# Patient Record
Sex: Female | Born: 1985 | Hispanic: Yes | Marital: Married | Smoking: Never smoker
Health system: Southern US, Community
[De-identification: ages and names within clinical notes are randomized; demographics above are authoritative.]

## PROBLEM LIST (undated history)

## (undated) DIAGNOSIS — O24419 Gestational diabetes mellitus in pregnancy, unspecified control: Secondary | ICD-10-CM

## (undated) DIAGNOSIS — Z789 Other specified health status: Secondary | ICD-10-CM

## (undated) HISTORY — PX: EYE SURGERY: SHX253

## (undated) HISTORY — DX: Gestational diabetes mellitus in pregnancy, unspecified control: O24.419

---

## 2010-05-06 ENCOUNTER — Ambulatory Visit (HOSPITAL_COMMUNITY): Admission: RE | Admit: 2010-05-06 | Discharge: 2010-05-06 | Payer: Self-pay | Admitting: Family Medicine

## 2010-06-20 ENCOUNTER — Ambulatory Visit (HOSPITAL_COMMUNITY): Admission: RE | Admit: 2010-06-20 | Discharge: 2010-06-20 | Payer: Self-pay | Admitting: Family Medicine

## 2010-11-24 ENCOUNTER — Ambulatory Visit (HOSPITAL_COMMUNITY)
Admission: RE | Admit: 2010-11-24 | Discharge: 2010-11-24 | Payer: Self-pay | Source: Home / Self Care | Attending: Obstetrics and Gynecology | Admitting: Obstetrics and Gynecology

## 2010-12-01 ENCOUNTER — Inpatient Hospital Stay (HOSPITAL_COMMUNITY)
Admission: AD | Admit: 2010-12-01 | Discharge: 2010-12-03 | DRG: 775 | Disposition: A | Discharge status: Home / Self Care | Payer: Medicaid Other | Source: Ambulatory Visit | Attending: Obstetrics and Gynecology | Admitting: Obstetrics and Gynecology

## 2010-12-01 DIAGNOSIS — L299 Pruritus, unspecified: Secondary | ICD-10-CM | POA: Diagnosis not present

## 2010-12-01 DIAGNOSIS — O26899 Other specified pregnancy related conditions, unspecified trimester: Secondary | ICD-10-CM | POA: Diagnosis not present

## 2010-12-01 DIAGNOSIS — O411090 Infection of amniotic sac and membranes, unspecified, unspecified trimester, not applicable or unspecified: Secondary | ICD-10-CM | POA: Diagnosis present

## 2010-12-01 DIAGNOSIS — O480 Post-term pregnancy: Principal | ICD-10-CM | POA: Diagnosis present

## 2010-12-01 LAB — CBC
MCH: 29.4 pg (ref 26.0–34.0)
RDW: 13.6 % (ref 11.5–15.5)
WBC: 10.6 10*3/uL — ABNORMAL HIGH (ref 4.0–10.5)

## 2010-12-01 LAB — RPR: RPR Ser Ql: NONREACTIVE

## 2010-12-02 DIAGNOSIS — O480 Post-term pregnancy: Secondary | ICD-10-CM

## 2010-12-02 DIAGNOSIS — O411090 Infection of amniotic sac and membranes, unspecified, unspecified trimester, not applicable or unspecified: Secondary | ICD-10-CM

## 2010-12-02 LAB — ABO/RH: ABO/RH(D): O POS

## 2011-10-31 NOTE — L&D Delivery Note (Signed)
I was present for the delivery of baby. Sharen Counter , CNM present for inspection of perineum and delivery of placement. I gree with above. No lacs  [place], [place] 10/17/2012 1:08 PM

## 2011-10-31 NOTE — L&D Delivery Note (Signed)
Delivery Note At 1032 a viable female was delivered via  (Presentation: LOA).  APGAR: pending documentation; weight pending. Placenta status: spontaneous, intact.  Cord: 3 vessels with the following complications: nuchal cord, delivered through.  Cord pH: not drawn Of note, pt delivered precipitously in MAU. Ivonne Andrew present for delivery of infant, L. Leftwich-Kirby present for delivery of placenta.  Anesthesia: None  Episiotomy: None Lacerations: None Suture Repair: n/a Est. Blood Loss (mL): 250  Mom to postpartum.  Baby to nursery-stable.  Street, Christopher 10/17/2012, 10:51 AM

## 2012-05-13 ENCOUNTER — Other Ambulatory Visit (HOSPITAL_COMMUNITY): Payer: Self-pay | Admitting: Nurse Practitioner

## 2012-05-13 DIAGNOSIS — Z3689 Encounter for other specified antenatal screening: Secondary | ICD-10-CM

## 2012-05-28 ENCOUNTER — Ambulatory Visit (HOSPITAL_COMMUNITY)
Admission: RE | Admit: 2012-05-28 | Discharge: 2012-05-28 | Disposition: A | Discharge status: Home / Self Care | Payer: Medicaid Other | Source: Ambulatory Visit | Attending: Nurse Practitioner | Admitting: Nurse Practitioner

## 2012-05-28 ENCOUNTER — Encounter (HOSPITAL_COMMUNITY): Payer: Self-pay

## 2012-05-28 DIAGNOSIS — Z3689 Encounter for other specified antenatal screening: Secondary | ICD-10-CM

## 2012-05-28 DIAGNOSIS — O358XX0 Maternal care for other (suspected) fetal abnormality and damage, not applicable or unspecified: Secondary | ICD-10-CM | POA: Insufficient documentation

## 2012-05-28 DIAGNOSIS — Z363 Encounter for antenatal screening for malformations: Secondary | ICD-10-CM | POA: Insufficient documentation

## 2012-05-28 DIAGNOSIS — Z1389 Encounter for screening for other disorder: Secondary | ICD-10-CM | POA: Insufficient documentation

## 2012-09-30 ENCOUNTER — Other Ambulatory Visit (HOSPITAL_COMMUNITY): Payer: Self-pay | Admitting: Family

## 2012-10-03 ENCOUNTER — Ambulatory Visit (HOSPITAL_COMMUNITY)
Admission: RE | Admit: 2012-10-03 | Discharge: 2012-10-03 | Disposition: A | Discharge status: Home / Self Care | Payer: Medicaid Other | Source: Ambulatory Visit | Attending: Family | Admitting: Family

## 2012-10-03 DIAGNOSIS — O365990 Maternal care for other known or suspected poor fetal growth, unspecified trimester, not applicable or unspecified: Secondary | ICD-10-CM | POA: Insufficient documentation

## 2012-10-03 DIAGNOSIS — Z3689 Encounter for other specified antenatal screening: Secondary | ICD-10-CM | POA: Insufficient documentation

## 2012-10-16 ENCOUNTER — Encounter (HOSPITAL_COMMUNITY): Payer: Self-pay

## 2012-10-16 ENCOUNTER — Inpatient Hospital Stay (HOSPITAL_COMMUNITY)
Admission: AD | Admit: 2012-10-16 | Discharge: 2012-10-16 | Disposition: A | Discharge status: Home / Self Care | Payer: Medicaid Other | Source: Ambulatory Visit | Attending: Obstetrics & Gynecology | Admitting: Obstetrics & Gynecology

## 2012-10-16 DIAGNOSIS — N949 Unspecified condition associated with female genital organs and menstrual cycle: Secondary | ICD-10-CM | POA: Insufficient documentation

## 2012-10-16 DIAGNOSIS — O99891 Other specified diseases and conditions complicating pregnancy: Secondary | ICD-10-CM | POA: Insufficient documentation

## 2012-10-16 DIAGNOSIS — O479 False labor, unspecified: Secondary | ICD-10-CM | POA: Insufficient documentation

## 2012-10-16 HISTORY — DX: Other specified health status: Z78.9

## 2012-10-16 NOTE — MAU Note (Signed)
Pt noticed some brown vaginal discharge at 6 pm tonight that is now light pink

## 2012-10-16 NOTE — MAU Provider Note (Signed)
  History     CSN: [id number]  Arrival date and time: 10/16/12 2143   None     Chief Complaint  Patient presents with  . Vaginal Discharge   HPI Judy David is 26yo, G3P1011, with [redacted]w[redacted]d presents c/o vaginal discharge brownish color around 18:00h and some vaginal bleeding. Also pt reports some contractions . She denied leaking fluid, she reports good fetal movement.   Past Medical History  Diagnosis Date  . No pertinent past medical history     Past Surgical History  Procedure Date  . Eye surgery     History reviewed. No pertinent family history.  History  Substance Use Topics  . Smoking status: Never Smoker   . Smokeless tobacco: Not on file  . Alcohol Use: No    Allergies: No Known Allergies  Prescriptions prior to admission  Medication Sig Dispense Refill  . acetaminophen (TYLENOL) 325 MG tablet Take 650 mg by mouth every 6 (six) hours as needed.      . prenatal vitamin w/FE, FA (PRENATAL 1 + 1) 27-1 MG TABS Take 1 tablet by mouth daily.        Review of Systems  Constitutional: Negative for fever.  Eyes: Negative for blurred vision.  Cardiovascular: Negative for chest pain.  Gastrointestinal: Negative for nausea and vomiting.  Neurological: Negative for headaches.   Physical Exam   Blood pressure 125/68, pulse 99, temperature 97.9 F (36.6 C), temperature source Oral, resp. rate 18, height 4\' 11"  (1.499 m), weight 76.658 kg (169 lb), last menstrual period 01/18/2012.  Physical Exam  Constitutional: She appears well-developed.  HENT:  Head: Normocephalic.  Cardiovascular: Normal rate and regular rhythm.   Respiratory: Effort normal and breath sounds normal.  FHT baseline 150, moderate variability, accels present, no decels, Category I Dilation: 2.5 Effacement (%): 50 Cervical Position: Posterior Station: -3 Presentation: Vertex Exam by:: Dr. Chelsea Aus   MAU Course  Procedures  MDM Labor check   Assessment and Plan  Judy  David is 26yo, [id number] with vaginal discharge. Labor check, no bleeding during the cervical exam Discussed labor signs Patient is stable to be discharge   Governor Specking 10/16/2012, 10:44 PM

## 2012-10-17 ENCOUNTER — Encounter (HOSPITAL_COMMUNITY): Payer: Self-pay | Admitting: *Deleted

## 2012-10-17 ENCOUNTER — Inpatient Hospital Stay (HOSPITAL_COMMUNITY)
Admission: AD | Admit: 2012-10-17 | Discharge: 2012-10-18 | DRG: 775 | Disposition: A | Discharge status: Home / Self Care | Payer: Medicaid Other | Source: Ambulatory Visit | Attending: Obstetrics & Gynecology | Admitting: Obstetrics & Gynecology

## 2012-10-17 DIAGNOSIS — Z283 Underimmunization status: Secondary | ICD-10-CM

## 2012-10-17 DIAGNOSIS — IMO0001 Reserved for inherently not codable concepts without codable children: Secondary | ICD-10-CM

## 2012-10-17 MED ORDER — ONDANSETRON HCL 4 MG/2ML IJ SOLN: 4.0000 mg | INTRAMUSCULAR | Status: DC | PRN

## 2012-10-17 MED ORDER — OXYCODONE-ACETAMINOPHEN 5-325 MG PO TABS: 1.0000 | ORAL_TABLET | ORAL | Status: DC | PRN

## 2012-10-17 MED ORDER — DIBUCAINE 1 % RE OINT: 1.0000 "application " | TOPICAL_OINTMENT | RECTAL | Status: DC | PRN

## 2012-10-17 MED ORDER — SENNOSIDES-DOCUSATE SODIUM 8.6-50 MG PO TABS
2.0000 | ORAL_TABLET | Freq: Every day | ORAL | Status: DC
  Administered 2012-10-17: 2 via ORAL

## 2012-10-17 MED ORDER — ZOLPIDEM TARTRATE 5 MG PO TABS: 5.0000 mg | ORAL_TABLET | Freq: Every evening | ORAL | Status: DC | PRN

## 2012-10-17 MED ORDER — TETANUS-DIPHTH-ACELL PERTUSSIS 5-2.5-18.5 LF-MCG/0.5 IM SUSP: 0.5000 mL | Freq: Once | INTRAMUSCULAR | Status: DC

## 2012-10-17 MED ORDER — WITCH HAZEL-GLYCERIN EX PADS: 1.0000 "application " | MEDICATED_PAD | CUTANEOUS | Status: DC | PRN

## 2012-10-17 MED ORDER — DIPHENHYDRAMINE HCL 25 MG PO CAPS: 25.0000 mg | ORAL_CAPSULE | Freq: Four times a day (QID) | ORAL | Status: DC | PRN

## 2012-10-17 MED ORDER — MEASLES, MUMPS & RUBELLA VAC ~~LOC~~ INJ
0.5000 mL | INJECTION | Freq: Once | SUBCUTANEOUS | Status: DC
  Filled 2012-10-17: qty 0.5

## 2012-10-17 MED ORDER — BENZOCAINE-MENTHOL 20-0.5 % EX AERO
1.0000 "application " | INHALATION_SPRAY | CUTANEOUS | Status: DC | PRN
  Administered 2012-10-17 – 2012-10-18 (×2): 1 via TOPICAL
  Filled 2012-10-17 (×2): qty 56

## 2012-10-17 MED ORDER — IBUPROFEN 600 MG PO TABS
600.0000 mg | ORAL_TABLET | Freq: Four times a day (QID) | ORAL | Status: DC
  Administered 2012-10-17 – 2012-10-18 (×5): 600 mg via ORAL
  Filled 2012-10-17 (×4): qty 1

## 2012-10-17 MED ORDER — LANOLIN HYDROUS EX OINT: TOPICAL_OINTMENT | CUTANEOUS | Status: DC | PRN

## 2012-10-17 MED ORDER — ONDANSETRON HCL 4 MG PO TABS: 4.0000 mg | ORAL_TABLET | ORAL | Status: DC | PRN

## 2012-10-17 MED ORDER — PRENATAL MULTIVITAMIN CH
1.0000 | ORAL_TABLET | Freq: Every day | ORAL | Status: DC
  Administered 2012-10-18: 1 via ORAL
  Filled 2012-10-17: qty 1

## 2012-10-17 MED ORDER — SIMETHICONE 80 MG PO CHEW: 80.0000 mg | CHEWABLE_TABLET | ORAL | Status: DC | PRN

## 2012-10-17 NOTE — MAU Note (Signed)
Pt appears very uncomfortable, no bleeding or leaking.

## 2012-10-17 NOTE — MAU Provider Note (Signed)
Seen and agree with note Lovett Coffin CNM 

## 2012-10-17 NOTE — H&P (Signed)
Judy David is a 26 y.o. female G2P2001 at [redacted]w[redacted]d by 18w [place], presenting in active labor. Contractions started ~5 hours ago and are now very strong with pt involuntarily pushing (see exam, below).  Denies LOF or gush of fluid prior to arrival; ROM apparent with fluid-soaked on bed between check where pt found to be complete and arrival of delivering provider Dr. Casper Harrison. No bleeding. Reported good fetal movements. Otherwise no complaints; no fever/chills, N/V, RUQ pain, new/worse swelling, headache, or change in vision.  [facility] Denies complications with pregnancy; per records is Rubella non-immune and was late to care at ~17w.  Maternal Medical History:  Reason for admission: Reason for admission: contractions.  Contractions: Frequency: regular.   Perceived severity is strong.    Fetal activity: Perceived fetal activity is normal.   Last perceived fetal movement was within the past hour.    Prenatal complications: no prenatal complications Prenatal Complications - Diabetes: none.    OB History    Grav Para Term Preterm Abortions TAB SAB Ect Mult Living   2 2 2       1      Past Medical History  Diagnosis Date  . No pertinent past medical history    Past Surgical History  Procedure Date  . Eye surgery    Family History: family history is not on file. Social History:  reports that she has never smoked. She does not have any smokeless tobacco history on file. She reports that she does not drink alcohol or use illicit drugs.  ROS: See HPI  Dilation: 10 Exam by:: Morrison Old RN Blood pressure 124/76, pulse 77, temperature 98.3 F (36.8 C), temperature source Oral, resp. rate 18, last menstrual period 01/18/2012, SpO2 96.00%, unknown if currently breastfeeding. Maternal Exam:  Uterine Assessment: Contraction strength is firm.  Contraction frequency is regular.   Abdomen: Patient reports no abdominal tenderness. Fetal presentation:  vertex  Introitus: Normal vulva. Normal vagina.  Ferning test: not done.  Amniotic fluid character: clear.  Pelvis: adequate for delivery.   Cervix: Cervix evaluated by digital exam.     Fetal Exam Fetal Monitor Review: Mode: ultrasound.   Baseline rate: 135.  Variability: moderate (6-25 bpm).   Pt delivered prior to having enough tracing to adequately evaluate FHR tracing, but appeared Category I  Fetal State Assessment: Category I - tracings are normal.     Physical Exam  Vitals reviewed. Constitutional: She is oriented to person, place, and time. She appears well-developed and well-nourished. She appears distressed (pt actively pushing on first exam).  HENT:  Head: Normocephalic and atraumatic.  Eyes: Conjunctivae normal are normal. Pupils are equal, round, and reactive to light.  Neck: Normal range of motion. Neck supple.  Cardiovascular: Normal rate, regular rhythm and normal heart sounds.   No murmur heard. Respiratory: Effort normal and breath sounds normal. She has no wheezes.  GI: Soft. Bowel sounds are normal.       Gravid; nontender immediately postpartum  Genitourinary: Vagina normal. Pelvic exam was performed with patient prone.       Dilation: 10 Presentation: Vertex Exam by:: Morrison Old RN  On my exam, baby's head was well-engaged at +2 station.  Musculoskeletal: Normal range of motion. She exhibits no edema.  Neurological: She is alert and oriented to person, place, and time.  Skin: Skin is warm and dry.  Psychiatric: She has a normal mood and affect. Her behavior is normal.    Prenatal labs: ABO, Rh:  O POS Antibody:   neg Rubella:   NON-IMMUNE RPR:   non-reactive HBsAg:   negative HIV:   non-reactive GBS:   negative  Assessment/Plan: 26 y.o. G2P2001 at [redacted]w[redacted]d by 18w [place] -[facility], precipitous delivery in maternal admissions unit  -admit to postpartum  -routine postpartum orders  -Rubella non-immune; to be vaccinated postpartum  Street,  Cristal Deer 10/17/2012, 7:24 PM

## 2012-10-18 LAB — CBC
Platelets: 121 10*3/uL — ABNORMAL LOW (ref 150–400)
RDW: 13.6 % (ref 11.5–15.5)
WBC: 15.2 10*3/uL — ABNORMAL HIGH (ref 4.0–10.5)

## 2012-10-18 MED ORDER — MEASLES, MUMPS & RUBELLA VAC ~~LOC~~ INJ
0.5000 mL | INJECTION | Freq: Once | SUBCUTANEOUS | Status: AC
  Administered 2012-10-18: 0.5 mL via SUBCUTANEOUS
  Filled 2012-10-18 (×2): qty 0.5

## 2012-10-18 MED ORDER — IBUPROFEN 600 MG PO TABS: 600.0000 mg | ORAL_TABLET | Freq: Four times a day (QID) | ORAL | Status: DC

## 2012-10-18 NOTE — Discharge Summary (Signed)
Obstetric Discharge Summary Reason for Admission: onset of labor Prenatal Procedures: none Intrapartum Procedures: spontaneous vaginal delivery Postpartum Procedures: none Complications-Operative and Postpartum: none Hemoglobin  Date Value Range Status  10/18/2012 13.9  12.0 - 15.0 g/dL Final     HCT  Date Value Range Status  10/18/2012 41.0  36.0 - 46.0 % Final    Physical Exam:  General: alert, cooperative and no distress Lochia: appropriate Uterine Fundus: firm DVT Evaluation: No evidence of DVT seen on physical exam.  Discharge Diagnoses: Term Pregnancy-delivered  Discharge Information: Date: 10/18/2012 Activity: pelvic rest Diet: routine Medications: Ibuprofen Condition: stable Instructions: refer to practice specific booklet Discharge to: home Pt is breastfeeding. Pt wants pills as birth control [facility]  Newborn Data: Live born female  Birth Weight: 6 lb 12.3 oz (3070 g) APGAR: 7, 10  Home with mother.  Governor Specking 10/18/2012, 7:34 AM

## 2012-10-18 NOTE — Discharge Summary (Addendum)
I have seen and examined this patient and agree the above assessment. Judy David,Judy David 10/18/2012 8:02 AM

## 2012-10-18 NOTE — Progress Notes (Signed)
Pediatrician aware that RPR has not resulted yet. Will follow up if reactive result. Ok to d/c baby

## 2014-03-04 IMAGING — US US OB FOLLOW-UP
1 series · 12 of 28 positions shown · non-contrast
Comparison: none

[Series 1: us ob follow up · 12 of 32 slices shown]
[im 2/32]
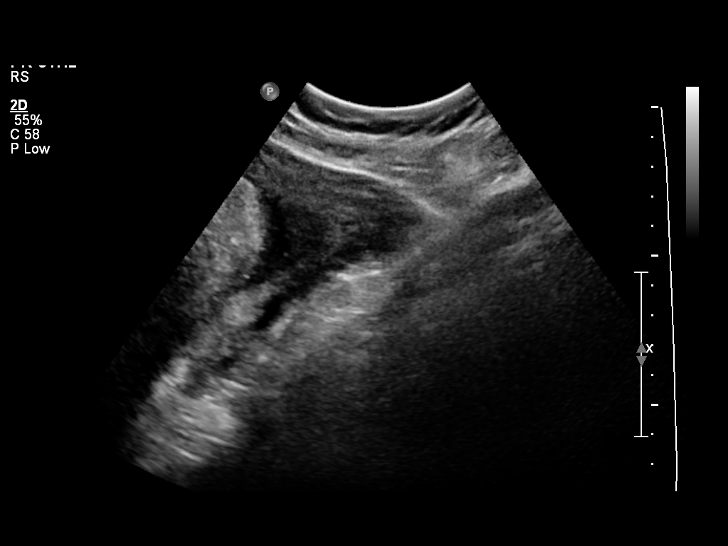
[im 4/32]
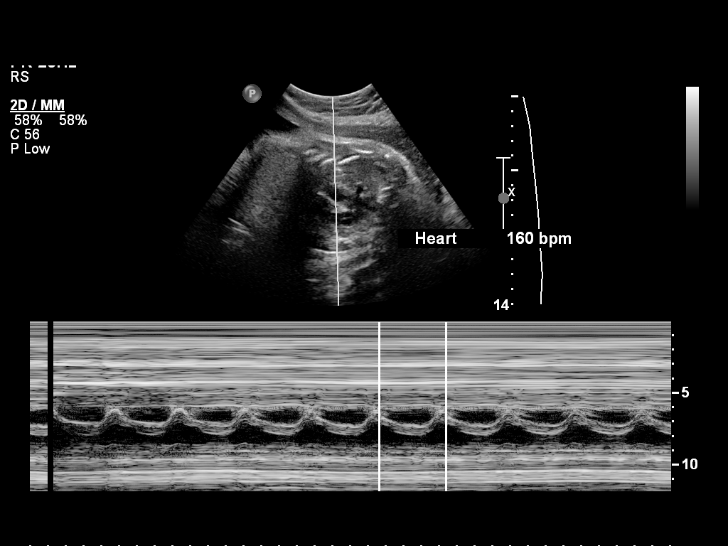
[im 6/32]
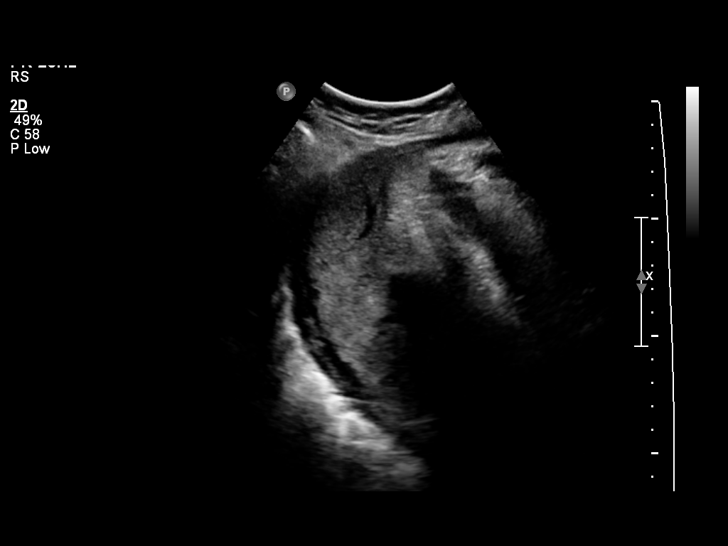
[im 10/32]
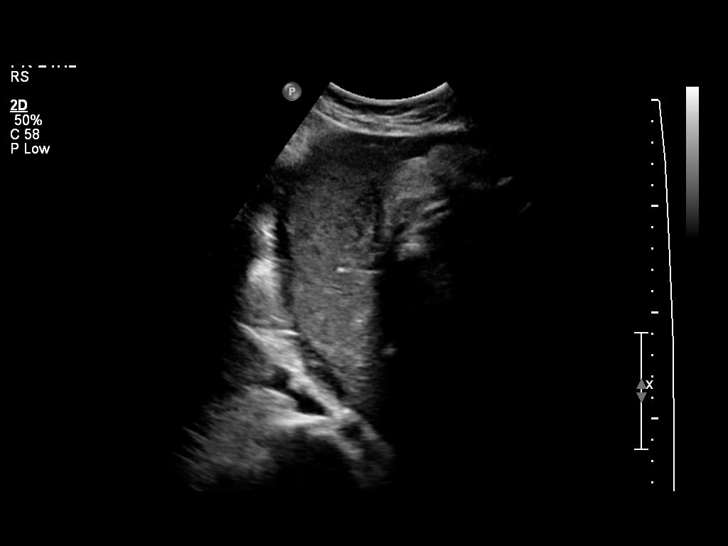
[im 12/32]
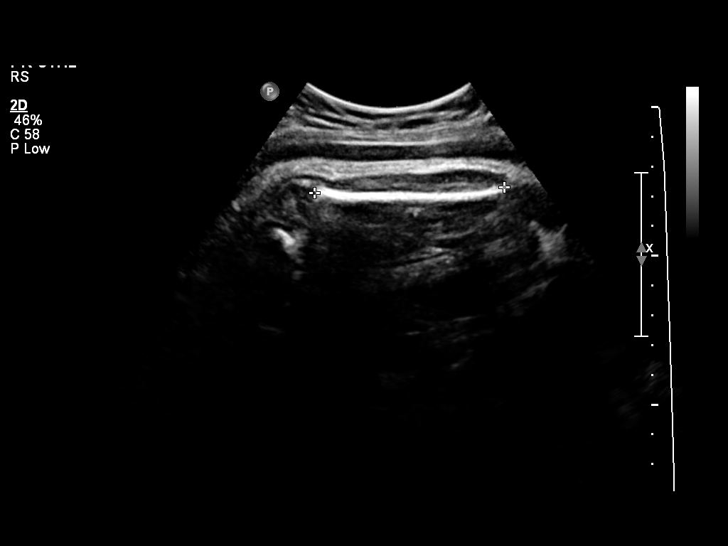
[im 14/32]
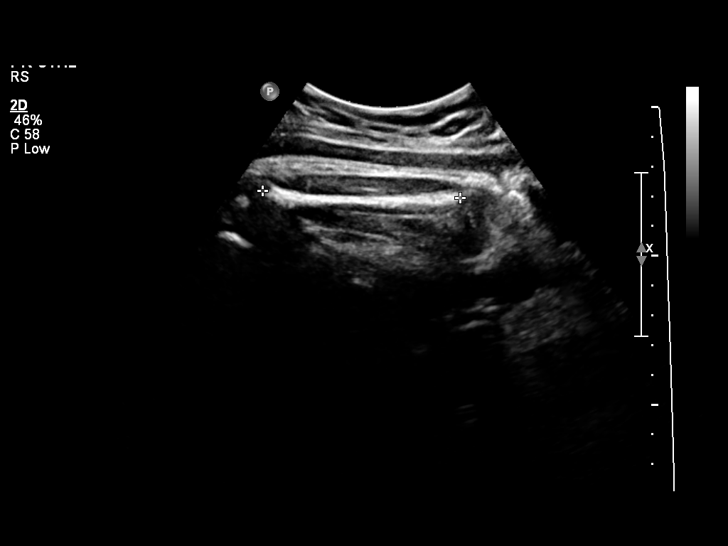
[im 18/32]
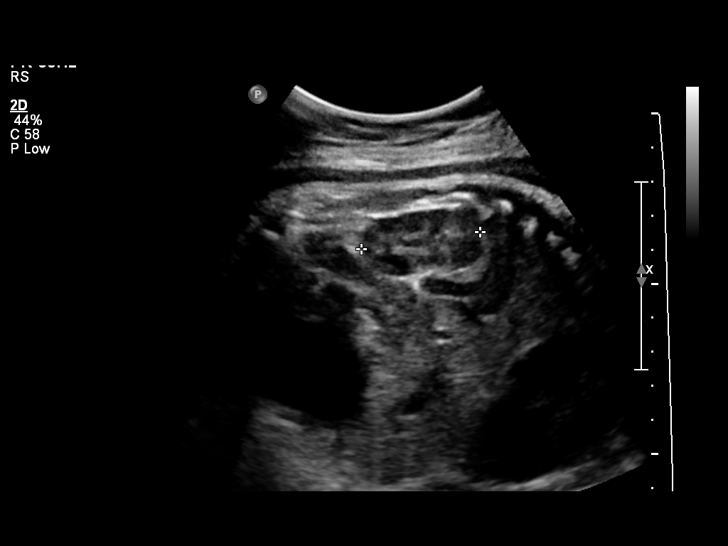
[im 20/32]
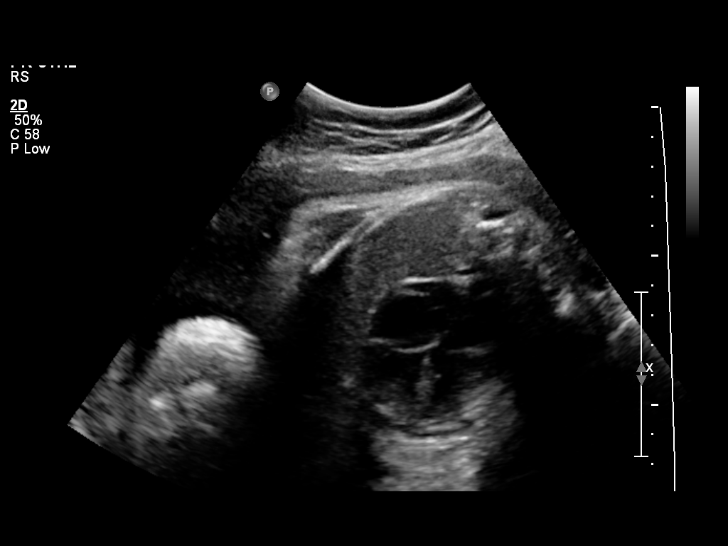
[im 22/32]
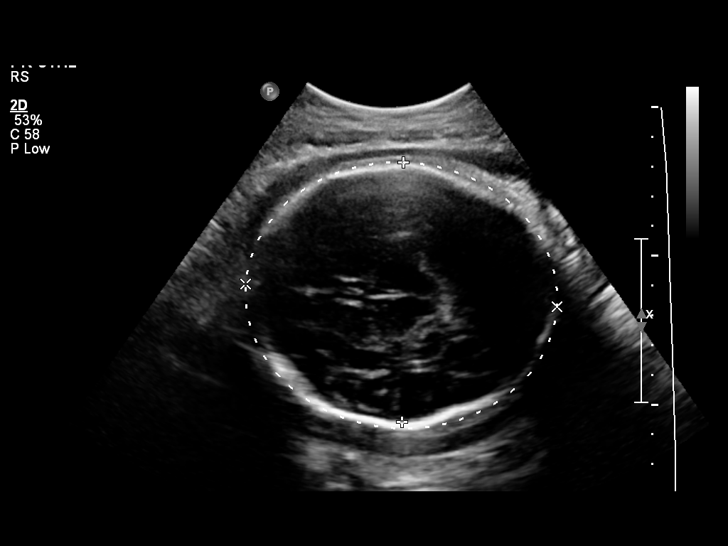
[im 26/32]
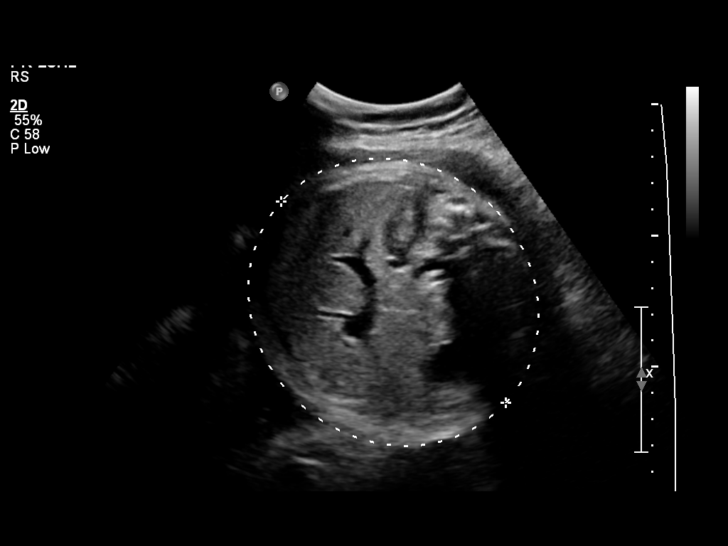
[im 28/32]
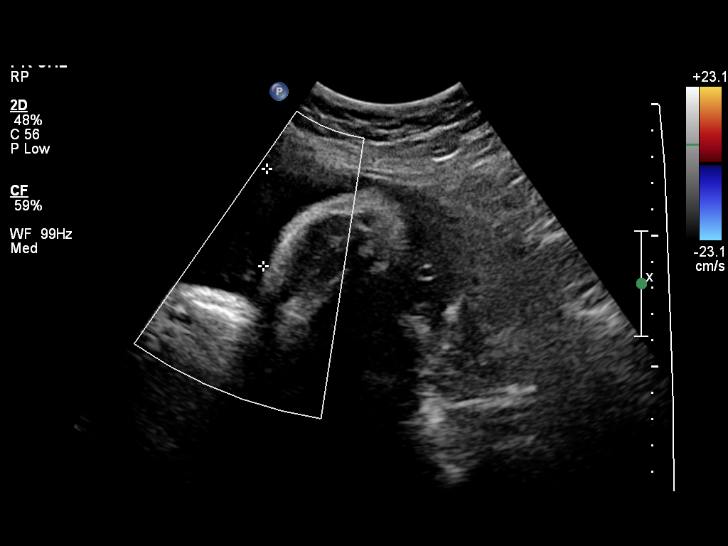
[im 30/32]
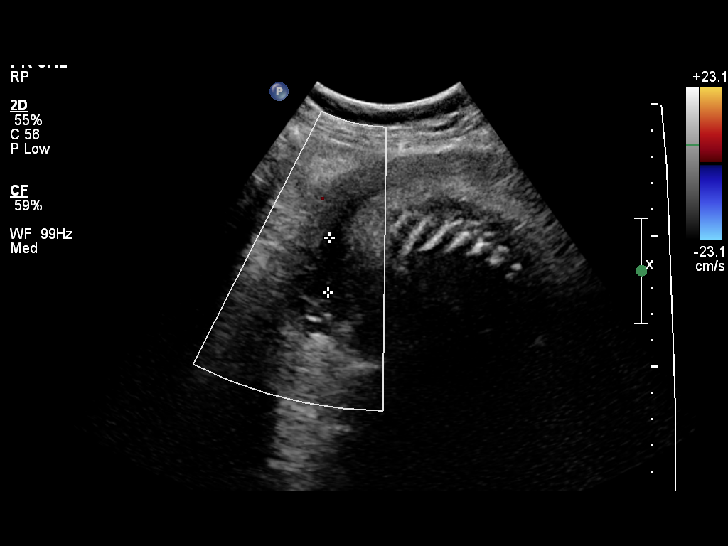

[12 of 28 positions shown; findings below may reference images not displayed]

OBSTETRICS REPORT
                      (Signed Final 10/03/2012 [DATE])

             JUELL

Service(s) Provided

 US OB FOLLOW UP                                       76816.1
Indications

 Size less than dates (Small for gestational [AGE]
 FGR)
Fetal Evaluation

 Num Of Fetuses:    1
 Fetal Heart Rate:  160                         bpm
 Cardiac Activity:  Observed
 Presentation:      Cephalic
 Placenta:          Posterior, above cervical
                    os
 P. Cord            Previously Visualized
 Insertion:

 Amniotic Fluid
 AFI FV:      Subjectively within normal limits
 AFI Sum:     10.23   cm      25   %Tile     Larg Pckt:     3.7  cm
 RUQ:   3.21   cm    RLQ:    3.7    cm    LUQ:   2.08    cm   LLQ:    1.24   cm
Biometry

 BPD:     86.9  mm    G. Age:   35w 0d                CI:        78.88   70 - 86
                                                      FL/HC:      21.1   20.8 -

 HC:     309.4  mm    G. Age:   34w 4d      < 3  %    HC/AC:      0.92   0.92 -

 AC:       338  mm    G. Age:   37w 5d       80  %    FL/BPD:     75.3   71 - 87
 FL:      65.4  mm    G. Age:   33w 5d      < 3  %    FL/AC:      19.3   20 - 24

 Est. FW:    6373  gm      6 lb 4 oz     48  %
Gestational Age

 LMP:           37w 0d       Date:   01/18/12                 EDD:   10/24/12
 U/S Today:     35w 2d                                        EDD:   11/05/12
 Best:          37w 0d    Det. By:   LMP  (01/18/12)          EDD:   10/24/12
Anatomy
 Cranium:          Appears normal         Aortic Arch:      Not well visualized
 Fetal Cavum:      Appears normal         Ductal Arch:      Not well visualized
 Ventricles:       Appears normal         Diaphragm:        Previously seen
 Choroid Plexus:   Previously seen        Stomach:          Appears normal
 Cerebellum:       Previously seen        Abdomen:          Appears normal
 Posterior Fossa:  Previously seen        Abdominal Wall:   Previously seen
 Nuchal Fold:      Previously seen        Cord Vessels:     Previously seen
 Face:             Orbits and profile     Kidneys:          Appear normal
                   previously seen
 Lips:             Previously seen        Bladder:          Appears normal
 Heart:            Appears normal         Spine:            Previously seen
                   (4CH, axis, and
                   situs)
 RVOT:             Previously seen        Limbs:            Appears normal
                                                            (hands, ankles, feet)
 LVOT:             Not well visualized

 Other:  Nasal bone, Heels, and Female gender previously seen. Technically
         difficult due to advanced GA.
Cervix Uterus Adnexa

 Cervix:       Not visualized (advanced GA >34 wks)

 Adnexa:     No abnormality visualized.
Impression

 Single intrauterine gestation demonstrating an estimated
 gestational age by ultrasound of 35w 2d. This is correlated
 with expected estimated gestational age by LMP of 37w 0d.
 EFW is currently at the 48%.

 No late developing fetal anatomic abnormalities are noted
 associated with the lateral ventricles, four chamber heart,
 stomach, kidneys or bladder.

 Subjectively and quantitatively normal amniotic fluid volume.

 BALTAZAR with us.  Please do not hesitate to

## 2014-08-31 ENCOUNTER — Encounter (HOSPITAL_COMMUNITY): Payer: Self-pay | Admitting: *Deleted

## 2020-10-30 NOTE — L&D Delivery Note (Signed)
OB/GYN Faculty Practice Delivery Note  Judy David is a 35 y.o. [id number] s/p SVD at [redacted]w[redacted]d. She was admitted for SOL.   ROM: 0h 82m with light meconium fluid GBS Status: Negative/-- (08/19 0929) Maximum Maternal Temperature: 98.42F  Labor Progress: Initial SVE: 7/90/-1. She then progressed to complete.   Delivery Date/Time: 07/05/2021, 3810 Delivery: Called to room and patient was complete and pushing. Head delivered midline, restituted to LOA. Loose nuchal cord present . Shoulder and body delivered in usual fashion. Infant with spontaneous cry, placed on mother's abdomen, dried and stimulated. Cord clamped x 2 after 1-minute delay, and cut by FOB. Cord blood drawn. Placenta delivered spontaneously with gentle cord traction. Fundus firm with massage and Pitocin. Labia, perineum, vagina, and cervix inspected inspected with a hemostatic small right labial and 1st degree perineal that did not require repair.   Baby Weight: pending  Placenta: Sent to L&D Complications: None Lacerations: right labial and 1st degree perineal  EBL: 100 mL Analgesia: None  Infant:  APGAR (1 MIN): 9   APGAR (5 MINS): 9   APGAR (10 MINS):     Leticia Penna, DO  OB Family Medicine Fellow, [facility] for [facility], [facility] 07/05/2021, 8:46 AM

## 2020-12-02 ENCOUNTER — Other Ambulatory Visit: Payer: Self-pay

## 2020-12-02 ENCOUNTER — Ambulatory Visit (INDEPENDENT_AMBULATORY_CARE_PROVIDER_SITE_OTHER): Payer: Self-pay

## 2020-12-02 ENCOUNTER — Encounter: Payer: Self-pay | Admitting: Family Medicine

## 2020-12-02 VITALS — BP 111/59 | HR 75

## 2020-12-02 DIAGNOSIS — Z3201 Encounter for pregnancy test, result positive: Secondary | ICD-10-CM

## 2020-12-02 LAB — POCT PREGNANCY, URINE: Preg Test, Ur: POSITIVE — AB

## 2020-12-02 NOTE — Progress Notes (Signed)
Video Interpreter # [id number] Pt here today for pregnancy test resulting positive.  Pt reports LMP 10/04/20 EDD 07/11/21 and [redacted]w[redacted]d.  Medications/allergies reviewed.  Pt denies vaginal bleeding or pain.  Pt advised to start [facility].  Pt verbalized understanding with no further questions.   Addison Naegeli, RN  12/02/20

## 2020-12-02 NOTE — Progress Notes (Signed)
Chart reviewed for nurse visit. Agree with plan of care.   Kooistra, Kathryn Lorraine, CNM 12/02/2020 4:59 PM   

## 2020-12-10 ENCOUNTER — Telehealth: Payer: Self-pay | Admitting: Family Medicine

## 2020-12-10 NOTE — Telephone Encounter (Signed)
Spanish---Pt had +preg and we have schedule her new ob appt, pt states that she is having some abd pains and needs nurse to call her back due to not sure what is going on.

## 2020-12-14 NOTE — Telephone Encounter (Signed)
Called patient with assistance of [employer] Spanish Zada Finders # [phone].   When called a message was received that # was an invalid number. There are no others numbers listed in the chart. Will need to try again at another time.

## 2020-12-14 NOTE — Telephone Encounter (Signed)
Called pt with interpreter Raquel; number is not in service. No other numbers available on chart. Will follow up at new OB appt.

## 2020-12-23 ENCOUNTER — Ambulatory Visit (INDEPENDENT_AMBULATORY_CARE_PROVIDER_SITE_OTHER): Payer: Self-pay | Admitting: Family Medicine

## 2020-12-23 ENCOUNTER — Encounter: Payer: Self-pay | Admitting: Family Medicine

## 2020-12-23 ENCOUNTER — Other Ambulatory Visit (HOSPITAL_COMMUNITY)
Admission: RE | Admit: 2020-12-23 | Discharge: 2020-12-23 | Disposition: A | Discharge status: Home / Self Care | Payer: Self-pay | Source: Ambulatory Visit | Attending: Family Medicine | Admitting: Family Medicine

## 2020-12-23 ENCOUNTER — Other Ambulatory Visit: Payer: Self-pay

## 2020-12-23 VITALS — BP 108/65 | HR 68 | Wt 173.2 lb

## 2020-12-23 DIAGNOSIS — E669 Obesity, unspecified: Secondary | ICD-10-CM | POA: Insufficient documentation

## 2020-12-23 DIAGNOSIS — Z789 Other specified health status: Secondary | ICD-10-CM

## 2020-12-23 DIAGNOSIS — N898 Other specified noninflammatory disorders of vagina: Secondary | ICD-10-CM | POA: Insufficient documentation

## 2020-12-23 DIAGNOSIS — O99210 Obesity complicating pregnancy, unspecified trimester: Secondary | ICD-10-CM | POA: Insufficient documentation

## 2020-12-23 DIAGNOSIS — L089 Local infection of the skin and subcutaneous tissue, unspecified: Secondary | ICD-10-CM

## 2020-12-23 DIAGNOSIS — E6609 Other obesity due to excess calories: Secondary | ICD-10-CM

## 2020-12-23 DIAGNOSIS — Z3480 Encounter for supervision of other normal pregnancy, unspecified trimester: Secondary | ICD-10-CM | POA: Insufficient documentation

## 2020-12-23 DIAGNOSIS — Z23 Encounter for immunization: Secondary | ICD-10-CM

## 2020-12-23 DIAGNOSIS — Z6834 Body mass index (BMI) 34.0-34.9, adult: Secondary | ICD-10-CM

## 2020-12-23 NOTE — Progress Notes (Unsigned)
History:   Judy David is a 35 y.o. [id number] at [redacted]w[redacted]d by LMP being seen today for her first obstetrical visit.  Her obstetrical history is significant for advanced maternal age and obesity. She reports 2 prior uncomplicated vaginal deliveries. Patient does intend to breast feed. Pregnancy history fully reviewed.  Patient reports no complaints.      HISTORY: OB History  Gravida Para Term Preterm AB Living  4 2 2  0 1 2  SAB IAB Ectopic Multiple Live Births  1 0 0 0 2    # Outcome Date GA Lbr Len/2nd Weight Sex Delivery Anes PTL Lv  4 Current           3 Term 10/17/12 [redacted]w[redacted]d / 00:12 6 lb 12.3 oz (3.07 kg) F Vag-Spont None  LIV     Birth Comments: none     Name: Judy David     Apgar1: 7  Apgar5: 10  2 SAB           1 Term     F Vag-Spont EPI      Last pap smear was done unknown, patient reports no history of abnormal pap smear.  No past medical history on file. Past Surgical History:  Procedure Laterality Date  . EYE SURGERY     History reviewed. No pertinent family history. Social History   Tobacco Use  . Smoking status: Never Smoker  . Smokeless tobacco: Never Used  Substance Use Topics  . Alcohol use: No  . Drug use: No   No Known Allergies Current Outpatient Medications on File Prior to Visit  Medication Sig Dispense Refill  . prenatal vitamin w/FE, FA (PRENATAL 1 + 1) 27-1 MG TABS Take 1 tablet by mouth daily.     No current facility-administered medications on file prior to visit.    Review of Systems Pertinent items noted in HPI and remainder of comprehensive ROS otherwise negative. Physical Exam:   Vitals:   12/23/20 1448  BP: 108/65  Pulse: 68  Weight: 173 lb 3.2 oz (78.6 kg)   Fetal Heart Rate (bpm): 162  Body mass index is 34.98 kg/m.  Uterus:     Pelvic Exam: Perineum: no hemorrhoids, normal perineum   Vulva: normal external genitalia, pustule noted on right labia   Vagina:  normal mucosa, normal discharge    Cervix: no lesions and normal, pap smear done.    Adnexa: normal adnexa and no mass, fullness, tenderness   Bony Pelvis: average  System: General: well-developed, well-nourished female in no acute distress   Breasts:  Not done   Skin: normal coloration and turgor, no rashes   Neurologic: oriented, normal, negative, normal mood   Extremities: normal strength, tone, and muscle mass, ROM of all joints is normal   HEENT PERRLA, extraocular movement intact and sclera clear, anicteric   Mouth/Teeth mucous membranes moist, pharynx normal without lesions and dental hygiene good   Neck supple and no masses   Cardiovascular: regular rate and rhythm   Respiratory:  no respiratory distress, normal breath sounds   Abdomen: soft, non-tender; no masses,  no organomegaly    Assessment:    Pregnancy: 12/25/20 Patient Active Problem List   Diagnosis Date Noted  . Pre-diabetes 12/24/2020  . Supervision of other normal pregnancy, antepartum 12/23/2020  . Language barrier 12/23/2020  . Obesity 12/23/2020     Plan:   Judy David @[redacted]w[redacted]d   Supervision of other normal pregnancy, antepartum -Doing well without complaints, taking PNV -Discussed bASA @16weeks  to  prevent preE given AMA and obesity -Discussed contraception, patient desires nexplanon, @HD  given no insurance -     Cytology - PAP( [place]) -     CBC/D/Plt+RPR+Rh+ABO+Rub Ab... -     Culture, OB Urine -     Flu Vaccine QUAD 36+ mos IM -     Genetic Screening -     Hemoglobin A1c -     Cervicovaginal ancillary only( [place]) -     MFM OB COMP + 14 WK; Future  Vaginal discharge Patient reports vaginal discharge for the past few weeks, will perform wet prep/gcc. Suspect normal discharge of pregnancy, unremarkable pelvic exam. -     Cervicovaginal ancillary only( [place])  Language barrier In person spanish interpreter used for entirety of visit.  Class 1 obesity due to excess calories without serious comorbidity  with body mass index (BMI) of 34.0 to 34.9 in adult BMI 35.  Labial pustule Pustule noted on right labia, painful. Not concerning for HSV, appears to be ingrown hair. Instructed to perform warm compresses and follow up at next appt if not resolved.   Initial labs drawn. Continue prenatal vitamins. Problem list reviewed and updated. Genetic Screening discussed, NIPS: ordered. Ultrasound discussed; fetal anatomic survey: ordered. Anticipatory guidance about prenatal visits given including labs, ultrasounds, and testing. Discussed usage of [employer]s and virtual visits as additional source of managing and completing prenatal visits in midst of coronavirus and pandemic.   Encouraged to complete MyChart Registration for her ability to review results, send requests, and have questions addressed.  The nature of [place] [facility] for [facility]/Faculty Practice with multiple MDs and Advanced Practice Providers was explained to patient; also emphasized that residents, students are part of our team. Routine obstetric precautions reviewed. Encouraged to seek out care at office or emergency room [facility] preferred) for urgent and/or emergent concerns. Return in about 4 weeks (around 01/20/2021) for LROB; in person.     01/22/2021, MD OB Fellow, Faculty [facility], Center for [facility] 12/24/2020 8:57 PM

## 2020-12-24 ENCOUNTER — Encounter: Payer: Self-pay | Admitting: Family Medicine

## 2020-12-24 DIAGNOSIS — R7303 Prediabetes: Secondary | ICD-10-CM | POA: Insufficient documentation

## 2020-12-24 LAB — CBC/D/PLT+RPR+RH+ABO+RUB AB...
Antibody Screen: NEGATIVE
Basophils Absolute: 0 10*3/uL (ref 0.0–0.2)
Basos: 0 %
EOS (ABSOLUTE): 0.2 10*3/uL (ref 0.0–0.4)
Eos: 1 %
HCV Ab: <0.1 s/co ratio (ref 0.0–0.9)
HIV Screen 4th Generation wRfx: NONREACTIVE
Hematocrit: 39.6 % (ref 34.0–46.6)
Hemoglobin: 13.5 g/dL (ref 11.1–15.9)
Hepatitis B Surface Ag: NEGATIVE
Immature Grans (Abs): 0.1 10*3/uL (ref 0.0–0.1)
Immature Granulocytes: 1 %
Lymphocytes Absolute: 2.5 10*3/uL (ref 0.7–3.1)
Lymphs: 21 %
MCH: 29 pg (ref 26.6–33.0)
MCHC: 34.1 g/dL (ref 31.5–35.7)
MCV: 85 fL (ref 79–97)
Monocytes Absolute: 0.7 10*3/uL (ref 0.1–0.9)
Monocytes: 6 %
Neutrophils Absolute: 8 10*3/uL — ABNORMAL HIGH (ref 1.4–7.0)
Neutrophils: 71 %
Platelets: 188 10*3/uL (ref 150–450)
RBC: 4.65 x10E6/uL (ref 3.77–5.28)
RDW: 12.6 % (ref 11.7–15.4)
RPR Ser Ql: NONREACTIVE
Rh Factor: POSITIVE
Rubella Antibodies, IGG: 15.8 index (ref >0.99)
WBC: 11.4 10*3/uL — ABNORMAL HIGH (ref 3.4–10.8)

## 2020-12-24 LAB — CERVICOVAGINAL ANCILLARY ONLY
Bacterial Vaginitis (gardnerella): NEGATIVE
Candida Glabrata: NEGATIVE
Candida Vaginitis: NEGATIVE
Chlamydia: NEGATIVE
Comment: NEGATIVE
Comment: NEGATIVE
Comment: NEGATIVE
Comment: NEGATIVE
Comment: NEGATIVE
Comment: NORMAL
Neisseria Gonorrhea: NEGATIVE
Trichomonas: NEGATIVE

## 2020-12-24 LAB — HEMOGLOBIN A1C
Est. average glucose Bld gHb Est-mCnc: 117 mg/dL
Hgb A1c MFr Bld: 5.7 % — ABNORMAL HIGH (ref 4.8–5.6)

## 2020-12-24 LAB — HCV INTERPRETATION

## 2020-12-25 LAB — URINE CULTURE, OB REFLEX

## 2020-12-25 LAB — CULTURE, OB URINE

## 2020-12-27 ENCOUNTER — Telehealth: Payer: Self-pay | Admitting: *Deleted

## 2020-12-27 LAB — CYTOLOGY - PAP
Comment: NEGATIVE
Diagnosis: NEGATIVE
High risk HPV: NEGATIVE

## 2020-12-27 NOTE — Telephone Encounter (Addendum)
-----   Message from Alric Seton, MD sent at 12/24/2020  8:48 PM EST ----- Need spanish interpreter-  Please call patient and notify lab work unremarkable, except patient does have elevated a1c (5.7) and likely has pre-diabetes outside of pregnancy. Please refer her to diabetes educator and ensure she has an early 2hr gtt. Thanks!  Alric Seton, MD OB Fellow, Faculty [facility], Center for [facility] 12/24/2020 8:47 PM  2/28  1515  Called pt w/[employer] interpreter [phone] and informed her of test results showing pre-diabetes is very likely. She was informed of need for 2hr GTT and agreed to appt on 3/7 @ 0820. All instructions were given. Pt voiced understanding.

## 2020-12-30 ENCOUNTER — Other Ambulatory Visit: Payer: Self-pay | Admitting: *Deleted

## 2020-12-30 DIAGNOSIS — Z3480 Encounter for supervision of other normal pregnancy, unspecified trimester: Secondary | ICD-10-CM

## 2020-12-30 DIAGNOSIS — R7303 Prediabetes: Secondary | ICD-10-CM

## 2021-01-03 ENCOUNTER — Other Ambulatory Visit: Payer: Self-pay

## 2021-01-03 DIAGNOSIS — R7303 Prediabetes: Secondary | ICD-10-CM

## 2021-01-03 DIAGNOSIS — Z3480 Encounter for supervision of other normal pregnancy, unspecified trimester: Secondary | ICD-10-CM

## 2021-01-04 LAB — GLUCOSE TOLERANCE, 2 HOURS W/ 1HR
Glucose, 1 hour: 157 mg/dL (ref 65–179)
Glucose, 2 hour: 128 mg/dL (ref 65–152)
Glucose, Fasting: 72 mg/dL (ref 65–91)

## 2021-01-18 ENCOUNTER — Other Ambulatory Visit: Payer: Self-pay

## 2021-01-18 ENCOUNTER — Ambulatory Visit (INDEPENDENT_AMBULATORY_CARE_PROVIDER_SITE_OTHER): Payer: Self-pay | Admitting: Nurse Practitioner

## 2021-01-18 VITALS — BP 113/53 | HR 63 | Wt 171.0 lb

## 2021-01-18 DIAGNOSIS — E669 Obesity, unspecified: Secondary | ICD-10-CM

## 2021-01-18 DIAGNOSIS — O09529 Supervision of elderly multigravida, unspecified trimester: Secondary | ICD-10-CM | POA: Insufficient documentation

## 2021-01-18 DIAGNOSIS — Z6834 Body mass index (BMI) 34.0-34.9, adult: Secondary | ICD-10-CM

## 2021-01-18 DIAGNOSIS — Z789 Other specified health status: Secondary | ICD-10-CM

## 2021-01-18 DIAGNOSIS — Z3480 Encounter for supervision of other normal pregnancy, unspecified trimester: Secondary | ICD-10-CM

## 2021-01-18 DIAGNOSIS — Z3A15 15 weeks gestation of pregnancy: Secondary | ICD-10-CM

## 2021-01-18 DIAGNOSIS — R7303 Prediabetes: Secondary | ICD-10-CM

## 2021-01-18 LAB — POCT URINALYSIS DIP (DEVICE)
Bilirubin Urine: NEGATIVE
Glucose, UA: NEGATIVE mg/dL
Ketones, ur: NEGATIVE mg/dL
Leukocytes,Ua: NEGATIVE
Nitrite: NEGATIVE
Protein, ur: NEGATIVE mg/dL
Specific Gravity, Urine: 1.01 (ref 1.005–1.030)
Urobilinogen, UA: 0.2 mg/dL (ref 0.0–1.0)
pH: 6 (ref 5.0–8.0)

## 2021-01-18 MED ORDER — ASPIRIN EC 81 MG PO TBEC: 81.0000 mg | DELAYED_RELEASE_TABLET | Freq: Every day | ORAL | 11 refills | Status: DC

## 2021-01-18 NOTE — Patient Instructions (Signed)
Clotrimazole Gynezol Monistat  NOT vagisil

## 2021-01-18 NOTE — Progress Notes (Signed)
    Subjective:  Judy David is a 35 y.o. [id number] at [redacted]w[redacted]d being seen today for ongoing prenatal care.  She is currently monitored for the following issues for this high-risk pregnancy and has Supervision of other normal pregnancy, antepartum; Language barrier; Obesity; and Pre-diabetes on their problem list.  Patient reports headaches releived by cold clots over the past 3 days.  Contractions: Irregular. Vag. Bleeding: None.  Movement: Present. Denies leaking of fluid.   The following portions of the patient's history were reviewed and updated as appropriate: allergies, current medications, past family history, past medical history, past social history, past surgical history and problem list. Problem list updated.  Objective:   Vitals:   01/18/21 1114  BP: (!) 113/53  Pulse: 63  Weight: 171 lb (77.6 kg)    Fetal Status: Fetal Heart Rate (bpm): 140 Fundal Height: 15 cm Movement: Present     General:  Alert, oriented and cooperative. Patient is in no acute distress.  Skin: Skin is warm and dry. No rash noted.   Cardiovascular: Normal heart rate noted  Respiratory: Normal respiratory effort, no problems with respiration noted  Abdomen: Soft, gravid, appropriate for gestational age. Pain/Pressure: Absent     Pelvic:  Cervical exam deferred        Extremities: Normal range of motion.  Edema: None  Mental Status: Normal mood and affect. Normal behavior. Normal judgment and thought content.   Urinalysis:      Assessment and Plan:  Pregnancy: [id number] at [redacted]w[redacted]d  1. Supervision of other normal pregnancy, antepartum Doing well - no vomiting.  Having some itching in folds of groin - advised to use OTC yeast cream externally - see AVS for notes  2. Pre-diabetes Early 2 hr in normal range but values seem high and at risk for developing gestational diabetes Informed patient testing will be done again. Reviewed having protein at every meal and avoiding high carbs High carbs  could be contributing to headaches. Got spanish version of diet recommended for gestational diabetes - even though she does not have diabetes at this time, it would be good to use the diet recommeded for getstional diabetes to decrease risk of developing diabetes.  3. Language barrier In person interpreter present for the entire visit.  4. Class 1 obesity without serious comorbidity with body mass index (BMI) of 34.0 to 34.9 in adult, unspecified obesity type Discussed time to start low dose ASA - prescribed   Preterm labor symptoms and general obstetric precautions including but not limited to vaginal bleeding, contractions, leaking of fluid and fetal movement were reviewed in detail with the patient. Please refer to After Visit Summary for other counseling recommendations.  Return in about 4 weeks (around 02/15/2021) for in person ROB.  Nolene Bernheim, RN, MSN, NP-BC Nurse Practitioner, [facility] for [employer], [facility] 01/18/2021 12:08 PM

## 2021-01-18 NOTE — Progress Notes (Signed)
C/o headaches=7 past 3 days. Used cold compresses to help make them go away.  Hellena Pridgen,RN

## 2021-02-08 ENCOUNTER — Encounter: Payer: Self-pay | Admitting: General Practice

## 2021-02-14 ENCOUNTER — Encounter: Payer: Self-pay | Admitting: *Deleted

## 2021-02-14 ENCOUNTER — Other Ambulatory Visit: Payer: Self-pay

## 2021-02-14 ENCOUNTER — Ambulatory Visit: Payer: Self-pay | Admitting: *Deleted

## 2021-02-14 ENCOUNTER — Other Ambulatory Visit: Payer: Self-pay | Admitting: Family Medicine

## 2021-02-14 ENCOUNTER — Other Ambulatory Visit: Payer: Self-pay | Admitting: *Deleted

## 2021-02-14 ENCOUNTER — Ambulatory Visit: Payer: Self-pay | Attending: Obstetrics and Gynecology

## 2021-02-14 DIAGNOSIS — Z3480 Encounter for supervision of other normal pregnancy, unspecified trimester: Secondary | ICD-10-CM

## 2021-02-14 DIAGNOSIS — O09522 Supervision of elderly multigravida, second trimester: Secondary | ICD-10-CM

## 2021-02-21 ENCOUNTER — Encounter: Payer: Self-pay | Admitting: Advanced Practice Midwife

## 2021-02-21 ENCOUNTER — Ambulatory Visit (INDEPENDENT_AMBULATORY_CARE_PROVIDER_SITE_OTHER): Payer: Self-pay | Admitting: Advanced Practice Midwife

## 2021-02-21 ENCOUNTER — Other Ambulatory Visit: Payer: Self-pay

## 2021-02-21 VITALS — BP 111/75 | HR 80 | Wt 170.6 lb

## 2021-02-21 DIAGNOSIS — Z3480 Encounter for supervision of other normal pregnancy, unspecified trimester: Secondary | ICD-10-CM

## 2021-02-21 DIAGNOSIS — Z6834 Body mass index (BMI) 34.0-34.9, adult: Secondary | ICD-10-CM

## 2021-02-21 DIAGNOSIS — Z3A20 20 weeks gestation of pregnancy: Secondary | ICD-10-CM

## 2021-02-21 DIAGNOSIS — Z789 Other specified health status: Secondary | ICD-10-CM

## 2021-02-21 DIAGNOSIS — R7303 Prediabetes: Secondary | ICD-10-CM

## 2021-02-21 DIAGNOSIS — E669 Obesity, unspecified: Secondary | ICD-10-CM

## 2021-02-21 MED ORDER — PRENATAL PLUS 27-1 MG PO TABS: 1.0000 | ORAL_TABLET | Freq: Every day | ORAL | 11 refills | Status: DC

## 2021-02-21 NOTE — Patient Instructions (Signed)
Segundo trimestre de embarazo Second Trimester of Pregnancy  El segundo trimestre de embarazo va desde la semana 13 hasta la semana 27. Tambin se dice que va desde el mes 4 hasta el mes 6 de embarazo. Este suele ser el momento en el que mejor se siente. Durante el segundo trimestre:  Las nuseas del embarazo han disminuido o han desaparecido.  Usted puede tener ms energa.  Usted puede tener hambre con ms frecuencia. En esta poca, el beb en gestacin (feto) crece muy rpido. Hacia el final del sexto mes, el beb en gestacin puede medir aproximadamente 12 pulgadas y pesar alrededor de 1 libras. Es probable que comience a sentir que el beb se mueve entre las 16 y las 20 semanas de embarazo. Cambios en el cuerpo durante el segundo trimestre Su organismo contina atravesando por muchos cambios durante este perodo. Los cambios varan y generalmente vuelven a la normalidad despus del nacimiento del beb. Cambios fsicos  Aumentar ms peso.  Podrn aparecer las primeras estras en las caderas, el vientre (abdomen) y las mamas.  Las mamas crecern y pueden doler.  Pueden aparecer zonas oscuras o manchas en el rostro.  Es posible que se forme una lnea oscura desde el ombligo hasta la zona del pubis (linea nigra).  Tal vez haya cambios en el cabello. Cambios en la salud  Es posible que tenga dolores de cabeza.  Es posible que tenga acidez estomacal.  Es posible que tenga dificultades para defecar (estreimiento).  Es posible que tenga hemorroides o venas abultadas e hinchadas (venas varicosas).  Las encas pueden sangrarle.  Es posible que haga pis (orine) con mayor frecuencia.  Puede sentir dolor en la espalda. Siga estas instrucciones en su casa: Medicamentos  Use los medicamentos de venta libre y los recetados solamente como se lo haya indicado el mdico. Algunos medicamentos no son seguros durante el embarazo.  Tome vitaminas prenatales que contengan por lo menos  600microgramos (mcg) de cido flico. Comida y bebida  Consuma comidas saludables que incluyan lo siguiente: ? Frutas y verduras frescas. ? Cereales integrales. ? Buenas fuentes de protenas, como carne, huevos y tofu. ? Productos lcteos con bajo contenido de grasa.  Evite la carne cruda y el jugo, la leche y el queso sin pasteurizar.  Es posible que deba tomar medidas para prevenir o tratar los problemas para defecar: ? Beber suficiente lquido para mantener el pis (orina) de color amarillo plido. ? Come alimentos ricos en fibra. Entre ellos, frijoles, cereales integrales y frutas y verduras frescas. ? Limitar los alimentos con alto contenido de grasa y azcar. Estos incluyen alimentos fritos o dulces. Actividad  Haga ejercicios solamente como se lo haya indicado el mdico. La mayora de las personas pueden realizar su actividad fsica habitual durante el embarazo. Intente realizar como mnimo 30minutos de actividad fsica por lo menos 5das a la semana.  Deje de hacer ejercicio si tiene dolor o clicos en el vientre o en la zona lumbar.  No haga ejercicio si hace demasiado calor, hay demasiada humedad o se encuentra en un lugar de mucha altura (altitud elevada).  Evite levantar pesos excesivos.  Si lo desea, puede continuar teniendo relaciones sexuales, a menos que el mdico le indique lo contrario. Alivio del dolor y del malestar  Use un sostn que le brinde buen soporte si le duelen las mamas.  Dese baos de asiento con agua tibia para aliviar el dolor o las molestias causadas por las hemorroides. Use una crema para las hemorroides si   el mdico la autoriza.  Descanse con las piernas levantadas (elevadas) si tiene calambres en las piernas o dolor en la parte baja de la espalda.  Si desarrolla venas abultadas en las piernas: ? Use medias de compresin segn las indicaciones de su mdico. ? Levante los pies durante 15minutos, 3 o 4veces por da. ? Limite la sal en sus  alimentos. Seguridad  Use el cinturn de seguridad en todo momento mientras vaya en auto.  Hable con el mdico si alguien le est haciendo dao o gritando mucho. Estilo de vida  No se d baos de inmersin en agua caliente, baos turcos ni saunas.  No se haga duchas vaginales. No use tampones ni toallas higinicas perfumadas.  Evite el contacto con las bandejas sanitarias de los gatos y la tierra que estos animales usan. Estos contienen grmenes que pueden daar al beb y causar la prdida del beb ya sea aborto espontneo o muerte fetal.  No consuma medicamentos a base de hierbas, drogas ilegales, ni medicamentos que el mdico no haya autorizado. No beba alcohol.  No fume ni consuma ningn producto que contenga nicotina o tabaco. Si necesita ayuda para dejar de fumar, consulte al mdico. Instrucciones generales  Cumpla con todas las visitas de seguimiento. Esto es importante.  Consulte a su mdico acerca de dnde se dictan clases prenatales cerca de donde vive.  Consulte a su mdico sobre los alimentos que debe comer o pdale que la ayude a encontrar a un asesor. Dnde buscar ms informacin  American Pregnancy Association (Asociacin Americana del Embarazo): americanpregnancy.org  American College of Obstetricians and Gynecologists (Colegio Estadounidense de Obstetras y Gineclogos): www.acog.org  Office on Women's Health (Oficina para la Salud de la Mujer): womenshealth.gov/pregnancy Comunquese con un mdico si:  Tiene un dolor de cabeza que no desaparece despus de tomar analgsicos.  Nota cambios en la visin o ve manchas delante de los ojos.  Tiene clicos o siente presin o dolor leves en la parte baja del vientre.  Sigue sintiendo como si fuera a vomitar (nuseas), vomita o hace deposiciones acuosas (diarrea).  Advierte lquido con mal olor que proviene de la vagina.  Siente dolor al orinar o hace orina con mal olor.  Tiene una gran hinchazn en la cara, las  manos, las piernas, los tobillos o los pies.  Tiene fiebre. Solicite ayuda de inmediato si:  Tiene una prdida de lquido por la vagina.  Tiene sangrado o pequeas prdidas vaginales.  Tiene clicos o dolor muy intensos en el vientre.  Tiene dificultad para respirar.  Sientes dolor en el pecho.  Se desmaya.  No ha sentido que el beb se moviera durante el perodo de tiempo que le dijo el mdico.  Tiene dolor, hinchazn o enrojecimiento nuevos en un brazo o una pierna o se produce un aumento de alguno de estos sntomas. Resumen  El segundo trimestre de embarazo va desde la semana 13 hasta la 27 (desde el mes 4 hasta el 6).  Consuma comidas saludables.  Haga ejercicios tal como le indic el mdico. La mayora de las personas pueden realizar su actividad fsica habitual durante el embarazo.  No consuma medicamentos a base de hierbas, drogas ilegales, ni medicamentos que el mdico no haya autorizado. No beba alcohol.  Llame al mdico si se enferma o si nota algo inusual acerca de su embarazo. Esta informacin no tiene como fin reemplazar el consejo del mdico. Asegrese de hacerle al mdico cualquier pregunta que tenga. Document Revised: 04/30/2020 Document Reviewed: 04/30/2020 Elsevier Patient Education    2021 Elsevier Inc.  

## 2021-02-21 NOTE — Progress Notes (Signed)
   PRENATAL VISIT NOTE  Subjective:  Judy David is a 35 y.o. [id number] at [redacted]w[redacted]d being seen today for ongoing prenatal care.  She is currently monitored for the following issues for this high-risk pregnancy and has Supervision of other normal pregnancy, antepartum; Language barrier; Obesity; Pre-diabetes; and Advanced maternal age in multigravida on their problem list.  Patient reports no complaints.   Movement: Present. Denies leaking of fluid.   The following portions of the patient's history were reviewed and updated as appropriate: allergies, current medications, past family history, past medical history, past social history, past surgical history and problem list. Problem list updated.  Objective:   Vitals:   02/21/21 1031  BP: 111/75  Pulse: 80  Weight: 170 lb 9.6 oz (77.4 kg)    Fetal Status: Fetal Heart Rate (bpm): 144   Movement: Present     General:  Alert, oriented and cooperative. Patient is in no acute distress.  Skin: Skin is warm and dry. No rash noted.   Cardiovascular: Normal heart rate noted  Respiratory: Normal respiratory effort, no problems with respiration noted  Abdomen: Soft, gravid, appropriate for gestational age.        Pelvic: Cervical exam deferred        Extremities: Normal range of motion.  Edema: None  Mental Status: Normal mood and affect. Normal behavior. Normal judgment and thought content.   Assessment and Plan:  Pregnancy: [id number] at [redacted]w[redacted]d  1. Supervision of other normal pregnancy, antepartum - Routine care, PNV refilled - Discussed physiologic changes in second trimester  2. Pre-diabetes - HgbA1C 5.7 - Early GTT normal   3. Class 1 obesity without serious comorbidity with body mass index (BMI) of 34.0 to 34.9 in adult, unspecified obesity type - TWG 2 lbs  4. [redacted] weeks gestation of pregnancy   5. Language barrier - Interpreter utilized for all patient interaction  Preterm labor symptoms and general obstetric precautions  including but not limited to vaginal bleeding, contractions, leaking of fluid and fetal movement were reviewed in detail with the patient. Please refer to After Visit Summary for other counseling recommendations.  Return in about 4 weeks (around 03/21/2021).  Future Appointments  Date Time Provider Department Center  03/21/2021  9:55 AM Currie Paris, NP [facility]  04/04/2021  9:30 AM [facility] NURSE [facility]  04/04/2021  9:45 AM [facility] US5 [facility]    Calvert Cantor, CNM

## 2021-03-21 ENCOUNTER — Ambulatory Visit (INDEPENDENT_AMBULATORY_CARE_PROVIDER_SITE_OTHER): Payer: Self-pay | Admitting: Nurse Practitioner

## 2021-03-21 ENCOUNTER — Other Ambulatory Visit: Payer: Self-pay

## 2021-03-21 ENCOUNTER — Encounter: Payer: Self-pay | Admitting: Nurse Practitioner

## 2021-03-21 VITALS — BP 103/72 | HR 82 | Wt 175.7 lb

## 2021-03-21 DIAGNOSIS — Z3480 Encounter for supervision of other normal pregnancy, unspecified trimester: Secondary | ICD-10-CM

## 2021-03-21 DIAGNOSIS — Z789 Other specified health status: Secondary | ICD-10-CM

## 2021-03-21 DIAGNOSIS — Z3A24 24 weeks gestation of pregnancy: Secondary | ICD-10-CM

## 2021-03-21 NOTE — Progress Notes (Signed)
    Subjective:  Judy David is a 35 y.o. [id number] at [redacted]w[redacted]d being seen today for ongoing prenatal care.  She is currently monitored for the following issues for this low-risk pregnancy and has Supervision of other normal pregnancy, antepartum; Language barrier; Obesity; Pre-diabetes; and Advanced maternal age in multigravida on their problem list.  Patient reports no complaints.  Contractions: Not present. Vag. Bleeding: None.  Movement: Present. Denies leaking of fluid.   The following portions of the patient's history were reviewed and updated as appropriate: allergies, current medications, past family history, past medical history, past social history, past surgical history and problem list. Problem list updated.  Objective:   Vitals:   03/21/21 1029  BP: 103/72  Pulse: 82  Weight: 175 lb 11.2 oz (79.7 kg)    Fetal Status: Fetal Heart Rate (bpm): 154 Fundal Height: 26 cm Movement: Present     General:  Alert, oriented and cooperative. Patient is in no acute distress.  Skin: Skin is warm and dry. No rash noted.   Cardiovascular: Normal heart rate noted  Respiratory: Normal respiratory effort, no problems with respiration noted  Abdomen: Soft, gravid, appropriate for gestational age. Pain/Pressure: Absent     Pelvic:  Cervical exam deferred        Extremities: Normal range of motion.  Edema: None  Mental Status: Normal mood and affect. Normal behavior. Normal judgment and thought content.   Urinalysis:      Assessment and Plan:  Pregnancy: [id number] at [redacted]w[redacted]d  1. Supervision of other normal pregnancy, antepartum Has not had history of gestational diabetes but HGB A1C was slightly elevated. Early 2 hr was normal Went to [employer] today. Reviewed guidance for next visit - fasting after midnight and advised TDAP was offered to her at next visit to protect baby from whooping cough.  Plans to get TDAP.  2. Language barrier In person interpreter present for the  entire visit today.  Preterm labor symptoms and general obstetric precautions including but not limited to vaginal bleeding, contractions, leaking of fluid and fetal movement were reviewed in detail with the patient. Please refer to After Visit Summary for other counseling recommendations.  Return in about 4 weeks (around 04/18/2021) for ppt for fastin glucose and ROB.  Nolene Bernheim, RN, MSN, NP-BC Nurse Practitioner, [facility] for [employer], [facility] 03/21/2021 12:49 PM

## 2021-04-04 ENCOUNTER — Ambulatory Visit: Payer: Self-pay | Attending: Maternal & Fetal Medicine

## 2021-04-04 ENCOUNTER — Other Ambulatory Visit: Payer: Self-pay | Admitting: *Deleted

## 2021-04-04 ENCOUNTER — Ambulatory Visit: Payer: Self-pay | Admitting: *Deleted

## 2021-04-04 ENCOUNTER — Encounter: Payer: Self-pay | Admitting: *Deleted

## 2021-04-04 ENCOUNTER — Other Ambulatory Visit: Payer: Self-pay

## 2021-04-04 DIAGNOSIS — O09522 Supervision of elderly multigravida, second trimester: Secondary | ICD-10-CM

## 2021-04-04 DIAGNOSIS — Z3480 Encounter for supervision of other normal pregnancy, unspecified trimester: Secondary | ICD-10-CM | POA: Insufficient documentation

## 2021-04-18 ENCOUNTER — Ambulatory Visit (INDEPENDENT_AMBULATORY_CARE_PROVIDER_SITE_OTHER): Payer: Self-pay | Admitting: Nurse Practitioner

## 2021-04-18 ENCOUNTER — Other Ambulatory Visit: Payer: Self-pay

## 2021-04-18 VITALS — BP 112/74 | HR 93 | Wt 177.3 lb

## 2021-04-18 DIAGNOSIS — Z3480 Encounter for supervision of other normal pregnancy, unspecified trimester: Secondary | ICD-10-CM

## 2021-04-18 DIAGNOSIS — O09529 Supervision of elderly multigravida, unspecified trimester: Secondary | ICD-10-CM

## 2021-04-18 DIAGNOSIS — Z23 Encounter for immunization: Secondary | ICD-10-CM

## 2021-04-18 DIAGNOSIS — Z5941 Food insecurity: Secondary | ICD-10-CM

## 2021-04-18 DIAGNOSIS — Z789 Other specified health status: Secondary | ICD-10-CM

## 2021-04-18 DIAGNOSIS — Z3A28 28 weeks gestation of pregnancy: Secondary | ICD-10-CM

## 2021-04-18 NOTE — Progress Notes (Signed)
    Subjective:  Judy David is a 35 y.o. [id number] at [redacted]w[redacted]d being seen today for ongoing prenatal care.  She is currently monitored for the following issues for this low-risk pregnancy and has Supervision of other normal pregnancy, antepartum; Language barrier; Obesity; Pre-diabetes; and Advanced maternal age in multigravida on their problem list.  Patient reports no complaints.  Contractions: Not present. Vag. Bleeding: None.  Movement: Present. Denies leaking of fluid.   The following portions of the patient's history were reviewed and updated as appropriate: allergies, current medications, past family history, past medical history, past social history, past surgical history and problem list. Problem list updated.  Objective:   Vitals:   04/18/21 0844  BP: 112/74  Pulse: 93  Weight: 177 lb 4.8 oz (80.4 kg)    Fetal Status: Fetal Heart Rate (bpm): 154 Fundal Height: 28 cm Movement: Present     General:  Alert, oriented and cooperative. Patient is in no acute distress.  Skin: Skin is warm and dry. No rash noted.   Cardiovascular: Normal heart rate noted  Respiratory: Normal respiratory effort, no problems with respiration noted  Abdomen: Soft, gravid, appropriate for gestational age. Pain/Pressure: Absent     Pelvic:  Cervical exam deferred        Extremities: Normal range of motion.  Edema: None  Mental Status: Normal mood and affect. Normal behavior. Normal judgment and thought content.   Urinalysis:      Assessment and Plan:  Pregnancy: [id number] at [redacted]w[redacted]d  1. Supervision of other normal pregnancy, antepartum Doing well.  Baby moving well No concerns  2. Food insecurity  - AMBULATORY REFERRAL TO [facility]  3. Antepartum multigravida of advanced maternal age Reviewed to pick up low dose aspirin and PNV as OTC Is on financial assistance only - did not prescribe medications  4.  Language barrier Video or in person interpreter used for the entire  visit  5. [redacted] weeks gestation of pregnancy Glucola done today - early glucola in March was normal.  - Tdap vaccine greater than or equal to 7yo IM  Preterm labor symptoms and general obstetric precautions including but not limited to vaginal bleeding, contractions, leaking of fluid and fetal movement were reviewed in detail with the patient. Please refer to After Visit Summary for other counseling recommendations.  Return in about 2 weeks (around 05/02/2021) for in person ROB.  Nolene Bernheim, RN, MSN, NP-BC Nurse Practitioner, [facility] for [employer], [facility] 04/18/2021 9:07 AM

## 2021-04-19 LAB — CBC
Hematocrit: 38.4 % (ref 34.0–46.6)
Hemoglobin: 12.8 g/dL (ref 11.1–15.9)
MCH: 28.9 pg (ref 26.6–33.0)
MCHC: 33.3 g/dL (ref 31.5–35.7)
MCV: 87 fL (ref 79–97)
Platelets: 146 10*3/uL — ABNORMAL LOW (ref 150–450)
RBC: 4.43 x10E6/uL (ref 3.77–5.28)
RDW: 12.9 % (ref 11.7–15.4)
WBC: 10.5 10*3/uL (ref 3.4–10.8)

## 2021-04-19 LAB — GLUCOSE TOLERANCE, 2 HOURS W/ 1HR
Glucose, 1 hour: 150 mg/dL (ref 65–179)
Glucose, 2 hour: 159 mg/dL — ABNORMAL HIGH (ref 65–152)
Glucose, Fasting: 83 mg/dL (ref 65–91)

## 2021-04-19 LAB — HIV ANTIBODY (ROUTINE TESTING W REFLEX): HIV Screen 4th Generation wRfx: NONREACTIVE

## 2021-04-19 LAB — RPR: RPR Ser Ql: NONREACTIVE

## 2021-04-20 ENCOUNTER — Other Ambulatory Visit: Payer: Self-pay | Admitting: Nurse Practitioner

## 2021-04-20 DIAGNOSIS — O24410 Gestational diabetes mellitus in pregnancy, diet controlled: Secondary | ICD-10-CM

## 2021-04-20 DIAGNOSIS — O24419 Gestational diabetes mellitus in pregnancy, unspecified control: Secondary | ICD-10-CM | POA: Insufficient documentation

## 2021-04-21 ENCOUNTER — Telehealth: Payer: Self-pay

## 2021-04-21 NOTE — Telephone Encounter (Signed)
-----   Message from Currie Paris, NP sent at 04/20/2021  6:00 PM EDT ----- Please call patient and discuss her new diagnosis of gestational diabetes.  Will order referral to Nutrition and Diabetes Management.

## 2021-04-21 NOTE — Telephone Encounter (Signed)
Called Pt using [place] [employer] id# [phone] to go over GDM test results & scheduling of Nutrition Diabetes appt., no answer, left VM for call back.

## 2021-05-03 ENCOUNTER — Other Ambulatory Visit: Payer: Self-pay

## 2021-05-06 ENCOUNTER — Ambulatory Visit (INDEPENDENT_AMBULATORY_CARE_PROVIDER_SITE_OTHER): Payer: Self-pay | Admitting: Family Medicine

## 2021-05-06 ENCOUNTER — Encounter: Payer: Self-pay | Admitting: Family Medicine

## 2021-05-06 ENCOUNTER — Other Ambulatory Visit: Payer: Self-pay

## 2021-05-06 VITALS — BP 113/68 | HR 81 | Wt 180.0 lb

## 2021-05-06 DIAGNOSIS — E669 Obesity, unspecified: Secondary | ICD-10-CM

## 2021-05-06 DIAGNOSIS — O24410 Gestational diabetes mellitus in pregnancy, diet controlled: Secondary | ICD-10-CM

## 2021-05-06 DIAGNOSIS — Z789 Other specified health status: Secondary | ICD-10-CM

## 2021-05-06 DIAGNOSIS — O09529 Supervision of elderly multigravida, unspecified trimester: Secondary | ICD-10-CM

## 2021-05-06 DIAGNOSIS — Z3480 Encounter for supervision of other normal pregnancy, unspecified trimester: Secondary | ICD-10-CM

## 2021-05-06 DIAGNOSIS — O99113 Other diseases of the blood and blood-forming organs and certain disorders involving the immune mechanism complicating pregnancy, third trimester: Secondary | ICD-10-CM

## 2021-05-06 DIAGNOSIS — Z6834 Body mass index (BMI) 34.0-34.9, adult: Secondary | ICD-10-CM

## 2021-05-06 DIAGNOSIS — D696 Thrombocytopenia, unspecified: Secondary | ICD-10-CM

## 2021-05-06 NOTE — Patient Instructions (Signed)
Eleccin del mtodo anticonceptivo Contraception Choices La anticoncepcin, o los mtodos anticonceptivos, hace referencia a los mtodos o dispositivos que evitan el embarazo. Mtodos hormonales Implante anticonceptivo Un implante anticonceptivo consiste en un tubo delgado de plstico que contiene una hormona que evita el embarazo. Es diferente de un dispositivo intrauterino (DIU). Un mdico lo inserta en la parte superior del brazo. Los implantes pueden ser eficaces durante un mximo de 3 aos. Inyecciones de progestina sola Las inyecciones de progestina sola contienen progestina, una forma sinttica de la hormona progesterona. Un mdico las administra cada 3 meses. Pldoras anticonceptivas Las pldoras anticonceptivas son pastillas que contienen hormonas que evitan el embarazo. Deben tomarse una vez al da, preferentemente a la misma hora cada da. Se necesita una receta para utilizar este mtodo anticonceptivo. Parche anticonceptivo El parche anticonceptivo contiene hormonas que evitan el embarazo. Se coloca en la piel, debe cambiarse una vez a la semana durante tres semanas y debe retirarse en la cuarta semana. Se necesita una receta para utilizar este mtodo anticonceptivo. Anillo vaginal Un anillo vaginal contiene hormonas que evitan el embarazo. Se coloca en la vagina durante tres semanas y se retira en la cuarta semana. Luego se repite el proceso con un anillo nuevo. Se necesita una receta para utilizar este mtodo anticonceptivo. Anticonceptivo de emergencia Los anticonceptivos de emergencia son mtodos para evitar un embarazo despus de tener sexo sin proteccin. Vienen en forma de pldora y pueden tomarse hasta 5 das despus de tener sexo. Funcionan mejor cuando se toman lo ms pronto posible luego de tener sexo. La mayora de los anticonceptivos de emergencia estn disponibles sin receta mdica. Este mtodo no debe utilizarse como el nico mtodo anticonceptivo. Mtodos de  barrera Condn masculino Un condn masculino es una vaina delgada que se coloca sobre el pene durante el sexo. Los condones evitan que el esperma ingrese en el cuerpo de la mujer. Pueden utilizarse con un una sustancia que mata a los espermatozoides (espermicida) para aumentar la efectividad. Deben desecharse despus de un uso. Condn femenino Un condn femenino es una vaina blanda y holgada que se coloca en la vagina antes de tener sexo. El condn evita que el esperma ingrese en el cuerpo de la mujer. Deben desecharse despus de un uso. Diafragma Un diafragma es una barrera blanda con forma de cpula. Se inserta en la vagina antes del sexo, junto con un espermicida. El diafragma bloquea el ingreso de esperma en el tero, y el espermicida mata a los espermatozoides. El diafragma debe permanecer en la vagina durante 6 a 8 horas despus de tener sexo y debe retirarse en el plazo de las 24 horas. Un diafragma es recetado y colocado por un mdico. Debe reemplazarse cada 1 a 2 aos, despus de dar a luz, de aumentar ms de 15lb (6.8kg) y de una ciruga plvica. Capuchn cervical Un capuchn cervical es una copa redonda y blanda de ltex o plstico que se coloca en el cuello uterino. Se inserta en la vagina antes del sexo, junto con un espermicida. Bloquea el ingreso del esperma en el tero. El capuchn debe permanecer en el lugar durante 6 a 8 horas despus de tener sexo y debe retirarse en el plazo de las 48 horas. Un capuchn cervical debe ser recetado y colocado por un mdico. Debe reemplazarse cada 2aos. Esponja Una esponja es una pieza blanda y circular de espuma de poliuretano que contiene espermicida. La esponja ayuda a bloquear el ingreso de esperma en el tero, y el espermicida mata a   los espermatozoides. Para utilizarla, debe humedecerla e insertarla en la vagina. Debe insertarse antes de tener sexo, debe permanecer dentro al menos durante 6 horas despus de tener sexo y debe retirarse y  desecharse en el plazo de las 30 horas. Espermicidas Los espermicidas son sustancias qumicas que matan o bloquean al esperma y no lo dejan ingresar al cuello uterino y al tero. Vienen en forma de crema, gel, supositorio, espuma o comprimido. Un espermicida debe insertarse en la vagina con un aplicador al menos 10 o 15 minutos antes de tener sexo para dar tiempo a que surta efecto. El proceso debe repetirse cada vez que tenga sexo. Los espermicidas no requieren receta mdica. Anticonceptivos intrauterinos Dispositivo intrauterino (DIU) Un DIU es un dispositivo en forma de T que se coloca en el tero. Existen dos tipos: DIU hormonal.Este tipo contiene progestina, una forma sinttica de la hormona progesterona. Este tipo puede permanecer colocado durante 3 a 5 aos. DIU de cobre.Este tipo est recubierto con un alambre de cobre. Puede permanecer colocado durante 10 aos. Mtodos anticonceptivos permanentes Ligadura de trompas en la mujer En este mtodo, se sellan, atan u obstruyen las trompas de Falopio durante una ciruga para evitar que el vulo descienda hacia el tero. Esterilizacin histeroscpica En este mtodo, se coloca un implante pequeo y flexible dentro de cada trompa de Falopio. Los implantes hacen que se forme un tejido cicatricial en las trompas de Falopio y que las obstruya para que el espermatozoide no pueda llegar al vulo. El procedimiento demora alrededor de 3 meses para que sea efectivo. Debe utilizarse otro mtodo anticonceptivo durante esos 3 meses. Esterilizacin masculina Este es un procedimiento que consiste en atar los conductos que transportan el esperma (vasectoma). Luego del procedimiento, el hombre puede eyacular lquido (semen). Debe utilizarse otro mtodo anticonceptivo durante 3 meses despus del procedimiento. Mtodos de planificacin natural Planificacin familiar natural En este mtodo, la pareja no tiene sexo durante los das en que la mujer podra quedar  embarazada. Mtodo calendario En este mtodo, la mujer realiza un seguimiento de la duracin de cada ciclo menstrual, identifica los das en los que se puede producir un embarazo y no tiene sexo durante esos das. Mtodo de la ovulacin En este mtodo, la pareja evita tener sexo durante la ovulacin. Mtodo sintotrmico Este mtodo implica no tener sexo durante la ovulacin. Normalmente, la mujer comprueba la ovulacin al observar cambios en su temperatura y en la consistencia del moco cervical. Mtodo posovulacin En este mtodo, la pareja espera a que finalice la ovulacin para tener sexo. Dnde buscar ms informacin Centers for Disease Control and Prevention (Centros para el Control y la Prevencin de Enfermedades): www.cdc.gov Resumen La anticoncepcin, o los mtodos anticonceptivos, hace referencia a los mtodos o dispositivos que evitan el embarazo. Los mtodos anticonceptivos hormonales incluyen implantes, inyecciones, pastillas, parches, anillos vaginales y anticonceptivos de emergencia. Los mtodos anticonceptivos de barrera pueden incluir condones masculinos, condones femeninos, diafragmas, capuchones cervicales, esponjas y espermicidas. Existen dos tipos de DIU (dispositivo intrauterino). Un DIU puede colocarse en el tero de una mujer para evitar el embarazo durante 3 a 5 aos. La esterilizacin permanente puede realizarse mediante un procedimiento tanto en los hombres como en las mujeres. Los mtodos de planificacin familiar natural implican no tener sexo durante los das en que la mujer podra quedar embarazada. Esta informacin no tiene como fin reemplazar el consejo del mdico. Asegrese de hacerle al mdico cualquier pregunta que tenga. Document Revised: 05/18/2020 Document Reviewed: 05/18/2020 Elsevier Patient Education  2022 Elsevier   Inc.  

## 2021-05-06 NOTE — Progress Notes (Signed)
   Subjective:  Judy David is a 35 y.o. [id number] at [redacted]w[redacted]d being seen today for ongoing prenatal care.  She is currently monitored for the following issues for this high-risk pregnancy and has Supervision of other normal pregnancy, antepartum; Language barrier; Obesity; Pre-diabetes; Advanced maternal age in multigravida; and Gestational diabetes on their problem list.  Patient reports no complaints.  Contractions: Irritability. Vag. Bleeding: None.  Movement: Present. Denies leaking of fluid.   The following portions of the patient's history were reviewed and updated as appropriate: allergies, current medications, past family history, past medical history, past social history, past surgical history and problem list. Problem list updated.  Objective:   Vitals:   05/06/21 0839  BP: 113/68  Pulse: 81  Weight: 180 lb (81.6 kg)    Fetal Status: Fetal Heart Rate (bpm): 149   Movement: Present     General:  Alert, oriented and cooperative. Patient is in no acute distress.  Skin: Skin is warm and dry. No rash noted.   Cardiovascular: Normal heart rate noted  Respiratory: Normal respiratory effort, no problems with respiration noted  Abdomen: Soft, gravid, appropriate for gestational age. Pain/Pressure: Absent     Pelvic: Vag. Bleeding: None     Cervical exam deferred        Extremities: Normal range of motion.  Edema: None  Mental Status: Normal mood and affect. Normal behavior. Normal judgment and thought content.   Urinalysis:      Assessment and Plan:  Pregnancy: [id number] at [redacted]w[redacted]d  1. Supervision of other normal pregnancy, antepartum BP and FHR normal  2. Diet controlled gestational diabetes mellitus (GDM) in third trimester Patient unaware of diagnosis until today as we had been unable to reach her Will get her to see DM educator in next available, unfortunately looks like won't be able to get a visit for about two weeks Supplies gathered from DM educator's office and  documented properly, teaching on QID fingersticks done. She will start checking sugars today and return with a log hopefully Has growth [place] scheduled for 05/16/21  3. Class 1 obesity without serious comorbidity with body mass index (BMI) of 34.0 to 34.9 in adult, unspecified obesity type   4. Language barrier Spanish  5. Antepartum multigravida of advanced maternal age   Preterm labor symptoms and general obstetric precautions including but not limited to vaginal bleeding, contractions, leaking of fluid and fetal movement were reviewed in detail with the patient. Please refer to After Visit Summary for other counseling recommendations.  Return in 2 weeks (on 05/20/2021) for [facility], ob visit, needs MD.   Venora Maples, MD

## 2021-05-16 ENCOUNTER — Ambulatory Visit: Payer: Self-pay | Attending: Obstetrics

## 2021-05-16 ENCOUNTER — Encounter: Payer: Self-pay | Admitting: *Deleted

## 2021-05-16 ENCOUNTER — Other Ambulatory Visit: Payer: Self-pay | Admitting: *Deleted

## 2021-05-16 ENCOUNTER — Ambulatory Visit: Payer: Self-pay | Admitting: *Deleted

## 2021-05-16 ENCOUNTER — Other Ambulatory Visit: Payer: Self-pay

## 2021-05-16 VITALS — BP 120/65 | HR 82

## 2021-05-16 DIAGNOSIS — Z3480 Encounter for supervision of other normal pregnancy, unspecified trimester: Secondary | ICD-10-CM

## 2021-05-16 DIAGNOSIS — O09523 Supervision of elderly multigravida, third trimester: Secondary | ICD-10-CM

## 2021-05-16 DIAGNOSIS — O09522 Supervision of elderly multigravida, second trimester: Secondary | ICD-10-CM | POA: Insufficient documentation

## 2021-05-17 ENCOUNTER — Ambulatory Visit: Payer: Self-pay | Admitting: Registered"

## 2021-05-17 ENCOUNTER — Ambulatory Visit (INDEPENDENT_AMBULATORY_CARE_PROVIDER_SITE_OTHER): Payer: Self-pay | Admitting: Family Medicine

## 2021-05-17 ENCOUNTER — Encounter: Payer: Self-pay | Admitting: Family Medicine

## 2021-05-17 ENCOUNTER — Encounter: Payer: Self-pay | Attending: Nurse Practitioner | Admitting: Registered"

## 2021-05-17 VITALS — BP 120/69 | HR 86 | Wt 178.0 lb

## 2021-05-17 DIAGNOSIS — Z3A00 Weeks of gestation of pregnancy not specified: Secondary | ICD-10-CM | POA: Insufficient documentation

## 2021-05-17 DIAGNOSIS — Z3480 Encounter for supervision of other normal pregnancy, unspecified trimester: Secondary | ICD-10-CM

## 2021-05-17 DIAGNOSIS — O24419 Gestational diabetes mellitus in pregnancy, unspecified control: Secondary | ICD-10-CM

## 2021-05-17 DIAGNOSIS — Z789 Other specified health status: Secondary | ICD-10-CM

## 2021-05-17 DIAGNOSIS — D696 Thrombocytopenia, unspecified: Secondary | ICD-10-CM

## 2021-05-17 DIAGNOSIS — O09523 Supervision of elderly multigravida, third trimester: Secondary | ICD-10-CM

## 2021-05-17 DIAGNOSIS — O24410 Gestational diabetes mellitus in pregnancy, diet controlled: Secondary | ICD-10-CM | POA: Insufficient documentation

## 2021-05-17 DIAGNOSIS — O99113 Other diseases of the blood and blood-forming organs and certain disorders involving the immune mechanism complicating pregnancy, third trimester: Secondary | ICD-10-CM

## 2021-05-17 NOTE — Progress Notes (Signed)
In-person interpreter present Raquel from [facility] health  Patient was seen for Gestational Diabetes self-management on 05/17/21  Start time 0825 and End time 0910   Estimated due date: 07/11/21; [redacted]w[redacted]d  Clinical: Medications: aspirin, prenatal Medical History: reviewed Labs: OGTT elevated 2 hr, A1c 5.7%   Dietary and Lifestyle History: Patient started checking blood sugar 10 days ago at MD visit. Patient was given basic instructions for balancing meals and reducing carb intake.   Patient has made some changes and her blood sugar has been mostly WNL since checking.  Physical Activity: 7x/week 15-20 min Stress: tries to keep her stress level low Sleep: 8 hr  24 hr Recall: First Meal: eggs, ww toast, a little fruit, tea no sugar (if coffee, more milk than coffee with small amount of sugar) Snack: fruit, healthy crackers Second meal: little rice, beans, avocado, chicken, salad Snack: green smoothie, papya, oatmeal, milk Third meal: eggs vegetables, greens, 3 tortillas  Snack: a little fruit Beverages: 3-4 bottles of water, tea peach  NUTRITION INTERVENTION  Nutrition education (E-1) on the following topics:   Initial Follow-up  [x]  []  Definition of Gestational Diabetes [x]  []  Why dietary management is important in controlling blood glucose [x]  []  Effects each nutrient has on blood glucose levels []  []  Simple carbohydrates vs complex carbohydrates []  []  Fluid intake [x]  []  Creating a balanced meal plan [x]  []  Carbohydrate counting  [x]  []  When to check blood glucose levels [x]  []  Proper blood glucose monitoring techniques [x]  []  Effect of stress and stress reduction techniques  [x]  []  Exercise effect on blood glucose levels, appropriate exercise during pregnancy [x]  []  Importance of limiting caffeine and abstaining from alcohol and smoking []  []  Medications used for blood sugar control during pregnancy []  []  Hypoglycemia and rule of 15 [x]  []  Postpartum self care  Patient  already has a meter, is testing pre breakfast and 2 hours after each meal. FBS: >80% WNL Postprandial: >80% WNL    Patient instructed to monitor glucose levels: FBS: 60 - ? 95 mg/dL (some clinics use 90 for cutoff) 1 hour: ? 140 mg/dL 2 hour: ? mg/dL  Patient received handouts: Nutrition Diabetes and Pregnancy Carbohydrate Counting List  Patient will be seen for follow-up as needed.

## 2021-05-17 NOTE — Progress Notes (Signed)
   Subjective:  Judy David is a 35 y.o. [phone] at [redacted]w[redacted]d being seen today for ongoing prenatal care.  She is currently monitored for the following issues for this high-risk pregnancy and has Supervision of other normal pregnancy, antepartum; Language barrier; Obesity in pregnancy; Pre-diabetes; Advanced maternal age in multigravida; Gestational diabetes; and Gestational thrombocytopenia (HCC) on their problem list.  Patient reports no complaints.  Contractions: Irritability. Vag. Bleeding: None.  Movement: Present. Denies leaking of fluid.   The following portions of the patient's history were reviewed and updated as appropriate: allergies, current medications, past family history, past medical history, past social history, past surgical history and problem list. Problem list updated.  Objective:   Vitals:   05/17/21 0912  BP: 120/69  Pulse: 86  Weight: 178 lb (80.7 kg)    Fetal Status: Fetal Heart Rate (bpm): 144   Movement: Present     General:  Alert, oriented and cooperative. Patient is in no acute distress.  Skin: Skin is warm and dry. No rash noted.   Cardiovascular: Normal heart rate noted  Respiratory: Normal respiratory effort, no problems with respiration noted  Abdomen: Soft, gravid, appropriate for gestational age. Pain/Pressure: Absent     Pelvic: Vag. Bleeding: None     Cervical exam deferred        Extremities: Normal range of motion.  Edema: None  Mental Status: Normal mood and affect. Normal behavior. Normal judgment and thought content.   Urinalysis:      Assessment and Plan:  Pregnancy: [id number] at [redacted]w[redacted]d  1. Supervision of other normal pregnancy, antepartum BP and FHR normal  2. Gestational diabetes mellitus (GDM), antepartum, gestational diabetes method of control unspecified Sugars are 77% at goal with diet, no meds at this time Normal growth and AFI w MFM yesterday Has follow up growth scheduled in 4 weeks  3. Benign gestational  thrombocytopenia in third trimester [facility]) Platelets 146 last check Recheck closer to delivery  4. Language barrier Spanish  5. Multigravida of advanced maternal age in third trimester   Preterm labor symptoms and general obstetric precautions including but not limited to vaginal bleeding, contractions, leaking of fluid and fetal movement were reviewed in detail with the patient. Please refer to After Visit Summary for other counseling recommendations.  Return in 2 weeks (on 05/31/2021) for [facility], ob visit.   Venora Maples, MD

## 2021-05-17 NOTE — Patient Instructions (Signed)
Eleccin del mtodo anticonceptivo Contraception Choices La anticoncepcin, o los mtodos anticonceptivos, hace referencia a los mtodos o dispositivos que evitan el embarazo. Mtodos hormonales Implante anticonceptivo Un implante anticonceptivo consiste en un tubo delgado de plstico que contiene una hormona que evita el embarazo. Es diferente de un dispositivo intrauterino (DIU). Un mdico lo inserta en la parte superior del brazo. Los implantes pueden ser eficaces durante un mximo de 3 aos. Inyecciones de progestina sola Las inyecciones de progestina sola contienen progestina, una forma sinttica de la hormona progesterona. Un mdico las administra cada 3 meses. Pldoras anticonceptivas Las pldoras anticonceptivas son pastillas que contienen hormonas que evitan el embarazo. Deben tomarse una vez al da, preferentemente a la misma hora cada da. Se necesita una receta para utilizar este mtodo anticonceptivo. Parche anticonceptivo El parche anticonceptivo contiene hormonas que evitan el embarazo. Se coloca en la piel, debe cambiarse una vez a la semana durante tres semanas y debe retirarse en la cuarta semana. Se necesita una receta para utilizar este mtodo anticonceptivo. Anillo vaginal Un anillo vaginal contiene hormonas que evitan el embarazo. Se coloca en la vagina durante tres semanas y se retira en la cuarta semana. Luego se repite el proceso con un anillo nuevo. Se necesita una receta para utilizar este mtodo anticonceptivo. Anticonceptivo de emergencia Los anticonceptivos de emergencia son mtodos para evitar un embarazo despus de tener sexo sin proteccin. Vienen en forma de pldora y pueden tomarse hasta 5 das despus de tener sexo. Funcionan mejor cuando se toman lo ms pronto posible luego de tener sexo. La mayora de los anticonceptivos de emergencia estn disponibles sin receta mdica. Este mtodo no debe utilizarse como el nico mtodo anticonceptivo. Mtodos de  barrera Condn masculino Un condn masculino es una vaina delgada que se coloca sobre el pene durante el sexo. Los condones evitan que el esperma ingrese en el cuerpo de la mujer. Pueden utilizarse con un una sustancia que mata a los espermatozoides (espermicida) para aumentar la efectividad. Deben desecharse despus de un uso. Condn femenino Un condn femenino es una vaina blanda y holgada que se coloca en la vagina antes de tener sexo. El condn evita que el esperma ingrese en el cuerpo de la mujer. Deben desecharse despus de un uso. Diafragma Un diafragma es una barrera blanda con forma de cpula. Se inserta en la vagina antes del sexo, junto con un espermicida. El diafragma bloquea el ingreso de esperma en el tero, y el espermicida mata a los espermatozoides. El diafragma debe permanecer en la vagina durante 6 a 8 horas despus de tener sexo y debe retirarse en el plazo de las 24 horas. Un diafragma es recetado y colocado por un mdico. Debe reemplazarse cada 1 a 2 aos, despus de dar a luz, de aumentar ms de 15lb (6.8kg) y de una ciruga plvica. Capuchn cervical Un capuchn cervical es una copa redonda y blanda de ltex o plstico que se coloca en el cuello uterino. Se inserta en la vagina antes del sexo, junto con un espermicida. Bloquea el ingreso del esperma en el tero. El capuchn debe permanecer en el lugar durante 6 a 8 horas despus de tener sexo y debe retirarse en el plazo de las 48 horas. Un capuchn cervical debe ser recetado y colocado por un mdico. Debe reemplazarse cada 2aos. Esponja Una esponja es una pieza blanda y circular de espuma de poliuretano que contiene espermicida. La esponja ayuda a bloquear el ingreso de esperma en el tero, y el espermicida mata a   los espermatozoides. Para utilizarla, debe humedecerla e insertarla en la vagina. Debe insertarse antes de tener sexo, debe permanecer dentro al menos durante 6 horas despus de tener sexo y debe retirarse y  desecharse en el plazo de las 30 horas. Espermicidas Los espermicidas son sustancias qumicas que matan o bloquean al esperma y no lo dejan ingresar al cuello uterino y al tero. Vienen en forma de crema, gel, supositorio, espuma o comprimido. Un espermicida debe insertarse en la vagina con un aplicador al menos 10 o 15 minutos antes de tener sexo para dar tiempo a que surta efecto. El proceso debe repetirse cada vez que tenga sexo. Los espermicidas no requieren receta mdica. Anticonceptivos intrauterinos Dispositivo intrauterino (DIU) Un DIU es un dispositivo en forma de T que se coloca en el tero. Existen dos tipos: DIU hormonal.Este tipo contiene progestina, una forma sinttica de la hormona progesterona. Este tipo puede permanecer colocado durante 3 a 5 aos. DIU de cobre.Este tipo est recubierto con un alambre de cobre. Puede permanecer colocado durante 10 aos. Mtodos anticonceptivos permanentes Ligadura de trompas en la mujer En este mtodo, se sellan, atan u obstruyen las trompas de Falopio durante una ciruga para evitar que el vulo descienda hacia el tero. Esterilizacin histeroscpica En este mtodo, se coloca un implante pequeo y flexible dentro de cada trompa de Falopio. Los implantes hacen que se forme un tejido cicatricial en las trompas de Falopio y que las obstruya para que el espermatozoide no pueda llegar al vulo. El procedimiento demora alrededor de 3 meses para que sea efectivo. Debe utilizarse otro mtodo anticonceptivo durante esos 3 meses. Esterilizacin masculina Este es un procedimiento que consiste en atar los conductos que transportan el esperma (vasectoma). Luego del procedimiento, el hombre puede eyacular lquido (semen). Debe utilizarse otro mtodo anticonceptivo durante 3 meses despus del procedimiento. Mtodos de planificacin natural Planificacin familiar natural En este mtodo, la pareja no tiene sexo durante los das en que la mujer podra quedar  embarazada. Mtodo calendario En este mtodo, la mujer realiza un seguimiento de la duracin de cada ciclo menstrual, identifica los das en los que se puede producir un embarazo y no tiene sexo durante esos das. Mtodo de la ovulacin En este mtodo, la pareja evita tener sexo durante la ovulacin. Mtodo sintotrmico Este mtodo implica no tener sexo durante la ovulacin. Normalmente, la mujer comprueba la ovulacin al observar cambios en su temperatura y en la consistencia del moco cervical. Mtodo posovulacin En este mtodo, la pareja espera a que finalice la ovulacin para tener sexo. Dnde buscar ms informacin Centers for Disease Control and Prevention (Centros para el Control y la Prevencin de Enfermedades): www.cdc.gov Resumen La anticoncepcin, o los mtodos anticonceptivos, hace referencia a los mtodos o dispositivos que evitan el embarazo. Los mtodos anticonceptivos hormonales incluyen implantes, inyecciones, pastillas, parches, anillos vaginales y anticonceptivos de emergencia. Los mtodos anticonceptivos de barrera pueden incluir condones masculinos, condones femeninos, diafragmas, capuchones cervicales, esponjas y espermicidas. Existen dos tipos de DIU (dispositivo intrauterino). Un DIU puede colocarse en el tero de una mujer para evitar el embarazo durante 3 a 5 aos. La esterilizacin permanente puede realizarse mediante un procedimiento tanto en los hombres como en las mujeres. Los mtodos de planificacin familiar natural implican no tener sexo durante los das en que la mujer podra quedar embarazada. Esta informacin no tiene como fin reemplazar el consejo del mdico. Asegrese de hacerle al mdico cualquier pregunta que tenga. Document Revised: 05/18/2020 Document Reviewed: 05/18/2020 Elsevier Patient Education  2022 Elsevier   Inc.  

## 2021-05-19 ENCOUNTER — Other Ambulatory Visit: Payer: Self-pay

## 2021-05-31 ENCOUNTER — Ambulatory Visit (INDEPENDENT_AMBULATORY_CARE_PROVIDER_SITE_OTHER): Payer: Self-pay | Admitting: Family Medicine

## 2021-05-31 ENCOUNTER — Other Ambulatory Visit (HOSPITAL_COMMUNITY)
Admission: RE | Admit: 2021-05-31 | Discharge: 2021-05-31 | Disposition: A | Discharge status: Home / Self Care | Payer: Self-pay | Source: Ambulatory Visit | Attending: Family Medicine | Admitting: Family Medicine

## 2021-05-31 ENCOUNTER — Other Ambulatory Visit: Payer: Self-pay

## 2021-05-31 ENCOUNTER — Encounter: Payer: Self-pay | Admitting: Family Medicine

## 2021-05-31 VITALS — BP 120/66 | HR 84 | Wt 181.2 lb

## 2021-05-31 DIAGNOSIS — Z3480 Encounter for supervision of other normal pregnancy, unspecified trimester: Secondary | ICD-10-CM

## 2021-05-31 DIAGNOSIS — N898 Other specified noninflammatory disorders of vagina: Secondary | ICD-10-CM

## 2021-05-31 DIAGNOSIS — Z789 Other specified health status: Secondary | ICD-10-CM

## 2021-05-31 DIAGNOSIS — O99210 Obesity complicating pregnancy, unspecified trimester: Secondary | ICD-10-CM

## 2021-05-31 DIAGNOSIS — O24410 Gestational diabetes mellitus in pregnancy, diet controlled: Secondary | ICD-10-CM

## 2021-05-31 DIAGNOSIS — O99113 Other diseases of the blood and blood-forming organs and certain disorders involving the immune mechanism complicating pregnancy, third trimester: Secondary | ICD-10-CM

## 2021-05-31 DIAGNOSIS — O09523 Supervision of elderly multigravida, third trimester: Secondary | ICD-10-CM

## 2021-05-31 DIAGNOSIS — D696 Thrombocytopenia, unspecified: Secondary | ICD-10-CM

## 2021-05-31 MED ORDER — FLUCONAZOLE 150 MG PO TABS: 150.0000 mg | ORAL_TABLET | Freq: Once | ORAL | 0 refills | Status: AC

## 2021-05-31 NOTE — Patient Instructions (Signed)

## 2021-05-31 NOTE — Progress Notes (Signed)
   Subjective:  Judy David is a 35 y.o. [phone] at [redacted]w[redacted]d being seen today for ongoing prenatal care.  She is currently monitored for the following issues for this high-risk pregnancy and has Supervision of other normal pregnancy, antepartum; Language barrier; Obesity in pregnancy; Pre-diabetes; Advanced maternal age in multigravida; Gestational diabetes; and Gestational thrombocytopenia (HCC) on their problem list.  Patient reports vaginal irritation.  Contractions: Irritability. Vag. Bleeding: None.  Movement: Present. Denies leaking of fluid.   The following portions of the patient's history were reviewed and updated as appropriate: allergies, current medications, past family history, past medical history, past social history, past surgical history and problem list. Problem list updated.  Objective:   Vitals:   05/31/21 0838  BP: 120/66  Pulse: 84  Weight: 181 lb 3.2 oz (82.2 kg)    Fetal Status: Fetal Heart Rate (bpm): 150   Movement: Present     General:  Alert, oriented and cooperative. Patient is in no acute distress.  Skin: Skin is warm and dry. No rash noted.   Cardiovascular: Normal heart rate noted  Respiratory: Normal respiratory effort, no problems with respiration noted  Abdomen: Soft, gravid, appropriate for gestational age. Pain/Pressure: Present     Pelvic: Vag. Bleeding: None     Cervical exam deferred        Extremities: Normal range of motion.  Edema: None  Mental Status: Normal mood and affect. Normal behavior. Normal judgment and thought content.   Urinalysis:      Assessment and Plan:  Pregnancy: [id number] at [redacted]w[redacted]d  1. Supervision of other normal pregnancy, antepartum BP and FHR normal Some vaginal itching, also some urinary symptoms. Empiric fluconazole for likely yeast infection, also send swab and UCx  2. Diet controlled gestational diabetes mellitus (GDM), antepartum Sugars 71% at goal Those at goal are well within range Discussed with  patient her abnormal values likely represent dietary indiscretions and she is borderline for starting meds Has growth scan scheduled for 06/14/21  3. Benign gestational thrombocytopenia in third trimester (HCC) 146 last check  4. Language barrier spanish  5. Obesity in pregnancy   6. Multigravida of advanced maternal age in third trimester   Preterm labor symptoms and general obstetric precautions including but not limited to vaginal bleeding, contractions, leaking of fluid and fetal movement were reviewed in detail with the patient. Please refer to After Visit Summary for other counseling recommendations.  Return in 2 weeks (on 06/14/2021) for [facility], ob visit.   Venora Maples, MD

## 2021-06-01 LAB — CERVICOVAGINAL ANCILLARY ONLY
Bacterial Vaginitis (gardnerella): NEGATIVE
Candida Glabrata: NEGATIVE
Candida Vaginitis: NEGATIVE
Chlamydia: NEGATIVE
Comment: NEGATIVE
Comment: NEGATIVE
Comment: NEGATIVE
Comment: NEGATIVE
Comment: NEGATIVE
Comment: NORMAL
Neisseria Gonorrhea: NEGATIVE
Trichomonas: NEGATIVE

## 2021-06-02 LAB — CULTURE, OB URINE

## 2021-06-02 LAB — URINE CULTURE, OB REFLEX

## 2021-06-14 ENCOUNTER — Ambulatory Visit: Payer: Self-pay | Admitting: *Deleted

## 2021-06-14 ENCOUNTER — Ambulatory Visit: Payer: Self-pay | Attending: Obstetrics and Gynecology

## 2021-06-14 ENCOUNTER — Encounter: Payer: Self-pay | Admitting: *Deleted

## 2021-06-14 ENCOUNTER — Other Ambulatory Visit: Payer: Self-pay

## 2021-06-14 VITALS — BP 107/62 | HR 70

## 2021-06-14 DIAGNOSIS — O09523 Supervision of elderly multigravida, third trimester: Secondary | ICD-10-CM | POA: Insufficient documentation

## 2021-06-14 DIAGNOSIS — Z3480 Encounter for supervision of other normal pregnancy, unspecified trimester: Secondary | ICD-10-CM

## 2021-06-14 DIAGNOSIS — N898 Other specified noninflammatory disorders of vagina: Secondary | ICD-10-CM

## 2021-06-14 MED ORDER — FLUCONAZOLE 150 MG PO TABS: 150.0000 mg | ORAL_TABLET | Freq: Once | ORAL | 0 refills | Status: AC

## 2021-06-14 NOTE — Telephone Encounter (Signed)
Patient came into the clinic stated she was not able to get her Diflucan at her pharmacy. CMA called [facility] on [address], and confirmed they did not receive anything for the patient on 05/31/2021.  CMA resent the Rx, and informed the patient the pharmacy will get it ready for her.   Shahid Flori Y/CMA  06/14/2021.

## 2021-06-17 ENCOUNTER — Other Ambulatory Visit: Payer: Self-pay

## 2021-06-17 ENCOUNTER — Other Ambulatory Visit (HOSPITAL_COMMUNITY)
Admission: RE | Admit: 2021-06-17 | Discharge: 2021-06-17 | Disposition: A | Discharge status: Home / Self Care | Payer: Self-pay | Source: Ambulatory Visit | Attending: Family Medicine | Admitting: Family Medicine

## 2021-06-17 ENCOUNTER — Encounter: Payer: Self-pay | Admitting: Family Medicine

## 2021-06-17 ENCOUNTER — Ambulatory Visit (INDEPENDENT_AMBULATORY_CARE_PROVIDER_SITE_OTHER): Payer: Self-pay | Admitting: Family Medicine

## 2021-06-17 VITALS — BP 110/73 | HR 77 | Wt 183.3 lb

## 2021-06-17 DIAGNOSIS — E669 Obesity, unspecified: Secondary | ICD-10-CM

## 2021-06-17 DIAGNOSIS — O99213 Obesity complicating pregnancy, third trimester: Secondary | ICD-10-CM

## 2021-06-17 DIAGNOSIS — O09523 Supervision of elderly multigravida, third trimester: Secondary | ICD-10-CM

## 2021-06-17 DIAGNOSIS — O99210 Obesity complicating pregnancy, unspecified trimester: Secondary | ICD-10-CM

## 2021-06-17 DIAGNOSIS — Z3480 Encounter for supervision of other normal pregnancy, unspecified trimester: Secondary | ICD-10-CM

## 2021-06-17 DIAGNOSIS — O99113 Other diseases of the blood and blood-forming organs and certain disorders involving the immune mechanism complicating pregnancy, third trimester: Secondary | ICD-10-CM

## 2021-06-17 DIAGNOSIS — Z789 Other specified health status: Secondary | ICD-10-CM

## 2021-06-17 DIAGNOSIS — R7303 Prediabetes: Secondary | ICD-10-CM

## 2021-06-17 DIAGNOSIS — Z3A36 36 weeks gestation of pregnancy: Secondary | ICD-10-CM

## 2021-06-17 DIAGNOSIS — D696 Thrombocytopenia, unspecified: Secondary | ICD-10-CM

## 2021-06-17 DIAGNOSIS — O24410 Gestational diabetes mellitus in pregnancy, diet controlled: Secondary | ICD-10-CM

## 2021-06-17 NOTE — Addendum Note (Signed)
Addended by: Marny Lowenstein on: 06/17/2021 08:52 AM   Modules accepted: Orders

## 2021-06-17 NOTE — Progress Notes (Signed)
   PRENATAL VISIT NOTE  Subjective:  Judy David is a 35 y.o. [phone] at [redacted]w[redacted]d being seen today for ongoing prenatal care.  She is currently monitored for the following issues for this high-risk pregnancy and has Supervision of other normal pregnancy, antepartum; Language barrier; Obesity in pregnancy; Pre-diabetes; Advanced maternal age in multigravida; Gestational diabetes; and Gestational thrombocytopenia (HCC) on their problem list.  The following portions of the patient's history were reviewed and updated as appropriate: allergies, current medications, past family history, past medical history, past social history, past surgical history and problem list.   Objective:   Vitals:   06/17/21 0825  BP: 110/73  Pulse: 77  Weight: 183 lb 4.8 oz (83.1 kg)    Fetal Status: Fetal Heart Rate (bpm): 153   Movement: Present  Presentation: Vertex  General:  Alert, oriented and cooperative. Patient is in no acute distress.  Skin: Skin is warm and dry. No rash noted.   Cardiovascular: Normal heart rate noted  Respiratory: Normal respiratory effort, no problems with respiration noted  Abdomen: Soft, gravid, appropriate for gestational age.  Pain/Pressure: Present     Pelvic: Cervical exam performed in the presence of a chaperone Dilation: 1 Effacement (%): 50 Station: -3  Extremities: Normal range of motion.  Edema: None  Mental Status: Normal mood and affect. Normal behavior. Normal judgment and thought content.   Assessment and Plan:  See documentation from Dr. Crissie Reese  Preterm labor symptoms and general obstetric precautions including but not limited to vaginal bleeding, contractions, leaking of fluid and fetal movement were reviewed in detail with the patient. Please refer to After Visit Summary for other counseling recommendations.   Return in 1 week (on 06/24/2021) for [facility], ob visit.  No future appointments.  Vonzella Nipple, PA-C

## 2021-06-17 NOTE — Progress Notes (Signed)
I connected with Judy David 06/17/21 at  8:15 AM EDT by: MyChart video and verified that I am speaking with the correct person using two identifiers.  Patient is located at [facility] and provider is located at home.     The purpose of this virtual visit is to provide medical care while limiting exposure to the novel coronavirus. I discussed the limitations, risks, security and privacy concerns of performing an evaluation and management service by MyChart video and the availability of in person appointments. I also discussed with the patient that there may be a patient responsible charge related to this service. By engaging in this virtual visit, you consent to the provision of healthcare.  Additionally, you authorize for your insurance to be billed for the services provided during this visit.  The patient expressed understanding and agreed to proceed.  The following staff members participated in the virtual visit:  Venora Maples, MD/MPH Attending Family Medicine Physician, Faculty [facility] for [facility], [facility]     PRENATAL VISIT NOTE  Subjective:  Judy David is a 35 y.o. [id number] at [redacted]w[redacted]d  for phone visit for ongoing prenatal care.  She is currently monitored for the following issues for this high-risk pregnancy and has Supervision of other normal pregnancy, antepartum; Language barrier; Obesity in pregnancy; Pre-diabetes; Advanced maternal age in multigravida; Gestational diabetes; and Gestational thrombocytopenia (HCC) on their problem list.  Patient reports no complaints.  Contractions: Not present. Vag. Bleeding: None.  Movement: Present. Denies leaking of fluid.   The following portions of the patient's history were reviewed and updated as appropriate: allergies, current medications, past family history, past medical history, past social history, past surgical history and problem list.   Objective:   Vitals:    06/17/21 0825  BP: 110/73  Pulse: 77  Weight: 183 lb 4.8 oz (83.1 kg)   Self-Obtained  Fetal Status: Fetal Heart Rate (bpm): 153   Movement: Present     Assessment and Plan:  Pregnancy: [id number] at [redacted]w[redacted]d 1. Supervision of other normal pregnancy, antepartum BP and FHR normal In clinic provider to collect swabs today  2. Diet controlled gestational diabetes mellitus (GDM), antepartum Sugar log reviewed, a few elevated fastings but otherwise good control Continue dietary measures Last growth [place] on 8/16 showed EFW 94% Plan for IOL at 39 weeks  3. Benign gestational thrombocytopenia in third trimester [facility]) Check cbc today  4. Language barrier spanish  5. Obesity in pregnancy   6. Multigravida of advanced maternal age in third trimester   7. Pre-diabetes   Preterm labor symptoms and general obstetric precautions including but not limited to vaginal bleeding, contractions, leaking of fluid and fetal movement were reviewed in detail with the patient.  Return in 1 week (on 06/24/2021) for [facility], ob visit.  No future appointments.   Time spent on virtual visit: 11 minutes  Venora Maples, MD

## 2021-06-17 NOTE — Progress Notes (Signed)
I performed the physical exam on this patient for today's visit due to Dr. Crissie Reese working remotely.

## 2021-06-18 LAB — CBC
Hematocrit: 41.1 % (ref 34.0–46.6)
Hemoglobin: 13.5 g/dL (ref 11.1–15.9)
MCH: 29 pg (ref 26.6–33.0)
MCHC: 32.8 g/dL (ref 31.5–35.7)
MCV: 88 fL (ref 79–97)
Platelets: 145 10*3/uL — ABNORMAL LOW (ref 150–450)
RBC: 4.65 x10E6/uL (ref 3.77–5.28)
RDW: 13.6 % (ref 11.7–15.4)
WBC: 8.9 10*3/uL (ref 3.4–10.8)

## 2021-06-20 LAB — GC/CHLAMYDIA PROBE AMP (~~LOC~~) NOT AT ARMC
Chlamydia: NEGATIVE
Comment: NEGATIVE
Comment: NORMAL
Neisseria Gonorrhea: NEGATIVE

## 2021-06-21 LAB — CULTURE, BETA STREP (GROUP B ONLY): Strep Gp B Culture: NEGATIVE

## 2021-06-23 ENCOUNTER — Encounter: Payer: Self-pay | Admitting: Obstetrics and Gynecology

## 2021-06-23 ENCOUNTER — Ambulatory Visit (INDEPENDENT_AMBULATORY_CARE_PROVIDER_SITE_OTHER): Payer: Self-pay | Admitting: Obstetrics and Gynecology

## 2021-06-23 ENCOUNTER — Other Ambulatory Visit: Payer: Self-pay

## 2021-06-23 VITALS — BP 121/77 | HR 79 | Wt 183.8 lb

## 2021-06-23 DIAGNOSIS — O99113 Other diseases of the blood and blood-forming organs and certain disorders involving the immune mechanism complicating pregnancy, third trimester: Secondary | ICD-10-CM

## 2021-06-23 DIAGNOSIS — D696 Thrombocytopenia, unspecified: Secondary | ICD-10-CM

## 2021-06-23 DIAGNOSIS — Z789 Other specified health status: Secondary | ICD-10-CM

## 2021-06-23 DIAGNOSIS — O3660X0 Maternal care for excessive fetal growth, unspecified trimester, not applicable or unspecified: Secondary | ICD-10-CM | POA: Insufficient documentation

## 2021-06-23 DIAGNOSIS — O99210 Obesity complicating pregnancy, unspecified trimester: Secondary | ICD-10-CM

## 2021-06-23 DIAGNOSIS — Z3480 Encounter for supervision of other normal pregnancy, unspecified trimester: Secondary | ICD-10-CM

## 2021-06-23 DIAGNOSIS — O09523 Supervision of elderly multigravida, third trimester: Secondary | ICD-10-CM

## 2021-06-23 DIAGNOSIS — O24410 Gestational diabetes mellitus in pregnancy, diet controlled: Secondary | ICD-10-CM

## 2021-06-23 DIAGNOSIS — O3663X0 Maternal care for excessive fetal growth, third trimester, not applicable or unspecified: Secondary | ICD-10-CM

## 2021-06-23 NOTE — Progress Notes (Signed)
   PRENATAL VISIT NOTE  Subjective:  Judy David is a 35 y.o. [phone] at [redacted]w[redacted]d being seen today for ongoing prenatal care.  She is currently monitored for the following issues for this high-risk pregnancy and has Supervision of other normal pregnancy, antepartum; Language barrier; Obesity in pregnancy; Pre-diabetes; Advanced maternal age in multigravida; Gestational diabetes; Gestational thrombocytopenia (HCC); and LGA (large for gestational age) fetus affecting management of mother on their problem list.  Patient reports no complaints.  Contractions: Irritability. Vag. Bleeding: None.  Movement: Present. Denies leaking of fluid.   The following portions of the patient's history were reviewed and updated as appropriate: allergies, current medications, past family history, past medical history, past social history, past surgical history and problem list.   Objective:   Vitals:   06/23/21 1532  BP: 121/77  Pulse: 79  Weight: 183 lb 12.8 oz (83.4 kg)    Fetal Status: Fetal Heart Rate (bpm): 156   Movement: Present     General:  Alert, oriented and cooperative. Patient is in no acute distress.  Skin: Skin is warm and dry. No rash noted.   Cardiovascular: Normal heart rate noted  Respiratory: Normal respiratory effort, no problems with respiration noted  Abdomen: Soft, gravid, appropriate for gestational age.  Pain/Pressure: Present     Pelvic: Cervical exam deferred        Extremities: Normal range of motion.  Edema: None  Mental Status: Normal mood and affect. Normal behavior. Normal judgment and thought content.   Assessment and Plan:  Pregnancy: [id number] at [redacted]w[redacted]d 1. Excessive fetal growth affecting management of pregnancy in third trimester, single or unspecified fetus 8/16: 94%, 3414g ac >99%; largest prior in the 6.5lbs range  2. Benign gestational thrombocytopenia in third trimester [facility]) CBC Latest Ref Rng & Units 06/17/2021 04/18/2021 12/23/2020  WBC 3.4 - 10.8  x10E3/uL 8.9 10.5 11.4(H)  Hemoglobin 11.1 - 15.9 g/dL 24.4 01.0 27.2  Hematocrit 34.0 - 46.6 % 41.1 38.4 39.6  Platelets 150 - 450 x10E3/uL 145(L) 146(L) 188    3. Diet controlled gestational diabetes mellitus (GDM), antepartum Normal cbg log today  4. Multigravida of advanced maternal age in third trimester  5. Supervision of other normal pregnancy, antepartum [facility] nexplanon GBS neg Pt set up for IOL 9/7 midnight IOL  6. Language barrier Interpreter used  7. Obesity in pregnancy  Term labor symptoms and general obstetric precautions including but not limited to vaginal bleeding, contractions, leaking of fluid and fetal movement were reviewed in detail with the patient. Please refer to After Visit Summary for other counseling recommendations.   Return in about 1 week (around 06/30/2021) for low risk ob, in person, md or app.  No future appointments.  [place] Bing, MD

## 2021-06-23 NOTE — Progress Notes (Signed)
IOL scheduled 9/7 midnight

## 2021-06-28 ENCOUNTER — Other Ambulatory Visit: Payer: Self-pay | Admitting: Advanced Practice Midwife

## 2021-06-30 ENCOUNTER — Other Ambulatory Visit: Payer: Self-pay

## 2021-06-30 ENCOUNTER — Telehealth (HOSPITAL_COMMUNITY): Payer: Self-pay | Admitting: *Deleted

## 2021-06-30 ENCOUNTER — Ambulatory Visit (INDEPENDENT_AMBULATORY_CARE_PROVIDER_SITE_OTHER): Payer: Self-pay | Admitting: Obstetrics and Gynecology

## 2021-06-30 ENCOUNTER — Encounter (HOSPITAL_COMMUNITY): Payer: Self-pay | Admitting: *Deleted

## 2021-06-30 VITALS — BP 132/83 | HR 78 | Wt 185.0 lb

## 2021-06-30 DIAGNOSIS — Z789 Other specified health status: Secondary | ICD-10-CM

## 2021-06-30 DIAGNOSIS — Z3A38 38 weeks gestation of pregnancy: Secondary | ICD-10-CM

## 2021-06-30 DIAGNOSIS — O24410 Gestational diabetes mellitus in pregnancy, diet controlled: Secondary | ICD-10-CM

## 2021-06-30 DIAGNOSIS — O99113 Other diseases of the blood and blood-forming organs and certain disorders involving the immune mechanism complicating pregnancy, third trimester: Secondary | ICD-10-CM

## 2021-06-30 DIAGNOSIS — Z3480 Encounter for supervision of other normal pregnancy, unspecified trimester: Secondary | ICD-10-CM

## 2021-06-30 DIAGNOSIS — O09523 Supervision of elderly multigravida, third trimester: Secondary | ICD-10-CM

## 2021-06-30 DIAGNOSIS — R300 Dysuria: Secondary | ICD-10-CM

## 2021-06-30 DIAGNOSIS — O3663X0 Maternal care for excessive fetal growth, third trimester, not applicable or unspecified: Secondary | ICD-10-CM

## 2021-06-30 DIAGNOSIS — D696 Thrombocytopenia, unspecified: Secondary | ICD-10-CM

## 2021-06-30 LAB — POCT URINALYSIS DIP (DEVICE)
Bilirubin Urine: NEGATIVE
Glucose, UA: NEGATIVE mg/dL
Ketones, ur: NEGATIVE mg/dL
Nitrite: NEGATIVE
Protein, ur: 30 mg/dL — AB
Specific Gravity, Urine: 1.025 (ref 1.005–1.030)
Urobilinogen, UA: 0.2 mg/dL (ref 0.0–1.0)
pH: 6.5 (ref 5.0–8.0)

## 2021-06-30 MED ORDER — PRENATAL PLUS 27-1 MG PO TABS: 1.0000 | ORAL_TABLET | Freq: Every day | ORAL | 11 refills | Status: AC

## 2021-06-30 NOTE — Progress Notes (Signed)
   PRENATAL VISIT NOTE  Subjective:  Judy David is a 35 y.o. [phone] at [redacted]w[redacted]d being seen today for ongoing prenatal care.  She is currently monitored for the following issues for this high-risk pregnancy and has Supervision of other normal pregnancy, antepartum; Language barrier; Obesity in pregnancy; Pre-diabetes; Advanced maternal age in multigravida; Gestational diabetes; Gestational thrombocytopenia (HCC); LGA (large for gestational age) fetus affecting management of mother; and [redacted] weeks gestation of pregnancy on their problem list.  Patient doing well with no acute concerns today. She reports  minor earache .  Contractions: Irritability. Vag. Bleeding: None.  Movement: Present. Denies leaking of fluid.   The following portions of the patient's history were reviewed and updated as appropriate: allergies, current medications, past family history, past medical history, past social history, past surgical history and problem list. Problem list updated.  Objective:   Vitals:   06/30/21 1045  BP: 132/83  Pulse: 78  Weight: 185 lb (83.9 kg)    Fetal Status: Fetal Heart Rate (bpm): 144   Movement: Present     General:  Alert, oriented and cooperative. Patient is in no acute distress.  Skin: Skin is warm and dry. No rash noted.   Cardiovascular: Normal heart rate noted  Respiratory: Normal respiratory effort, no problems with respiration noted  Abdomen: Soft, gravid, appropriate for gestational age.  Pain/Pressure: Present     Pelvic: Cervical exam deferred        Extremities: Normal range of motion.  Edema: None  Mental Status:  Normal mood and affect. Normal behavior. Normal judgment and thought content.   Assessment and Plan:  Pregnancy: [id number] at [redacted]w[redacted]d  1. Diet controlled gestational diabetes mellitus (GDM), antepartum FBS: 82-93 PPBS:86-110  2. Benign gestational thrombocytopenia in third trimester (HCC) Last plts 145  3. Supervision of other normal  pregnancy, antepartum Pt scheduled for IOL on 9/7  4. Language barrier Video interpreter used  5. Multigravida of advanced maternal age in third trimester   6. Excessive fetal growth affecting management of pregnancy in third trimester, single or unspecified fetus   7. [redacted] weeks gestation of pregnancy   Term labor symptoms and general obstetric precautions including but not limited to vaginal bleeding, contractions, leaking of fluid and fetal movement were reviewed in detail with the patient.  Please refer to After Visit Summary for other counseling recommendations.      Mariel Aloe, MD Faculty Attending Center for [facility]

## 2021-06-30 NOTE — Telephone Encounter (Signed)
[id number] interpreter number

## 2021-06-30 NOTE — Addendum Note (Signed)
Addended byVidal Schwalbe on: 06/30/2021 11:24 AM   Modules accepted: Orders

## 2021-07-02 LAB — URINE CULTURE, OB REFLEX

## 2021-07-02 LAB — CULTURE, OB URINE

## 2021-07-03 ENCOUNTER — Encounter (HOSPITAL_COMMUNITY): Payer: Self-pay

## 2021-07-05 ENCOUNTER — Encounter (HOSPITAL_COMMUNITY): Payer: Self-pay | Admitting: Obstetrics and Gynecology

## 2021-07-05 ENCOUNTER — Other Ambulatory Visit: Payer: Self-pay

## 2021-07-05 ENCOUNTER — Inpatient Hospital Stay (HOSPITAL_COMMUNITY)
Admission: AD | Admit: 2021-07-05 | Discharge: 2021-07-06 | DRG: 806 | Disposition: A | Discharge status: Home / Self Care | Payer: Medicaid Other | Attending: Obstetrics and Gynecology | Admitting: Obstetrics and Gynecology

## 2021-07-05 DIAGNOSIS — O99214 Obesity complicating childbirth: Secondary | ICD-10-CM | POA: Diagnosis present

## 2021-07-05 DIAGNOSIS — Z3480 Encounter for supervision of other normal pregnancy, unspecified trimester: Secondary | ICD-10-CM

## 2021-07-05 DIAGNOSIS — O24420 Gestational diabetes mellitus in childbirth, diet controlled: Principal | ICD-10-CM | POA: Diagnosis present

## 2021-07-05 DIAGNOSIS — Z3A39 39 weeks gestation of pregnancy: Secondary | ICD-10-CM

## 2021-07-05 DIAGNOSIS — Z789 Other specified health status: Secondary | ICD-10-CM | POA: Diagnosis present

## 2021-07-05 DIAGNOSIS — Z603 Acculturation difficulty: Secondary | ICD-10-CM | POA: Diagnosis present

## 2021-07-05 DIAGNOSIS — O99119 Other diseases of the blood and blood-forming organs and certain disorders involving the immune mechanism complicating pregnancy, unspecified trimester: Secondary | ICD-10-CM | POA: Diagnosis present

## 2021-07-05 DIAGNOSIS — D6959 Other secondary thrombocytopenia: Secondary | ICD-10-CM | POA: Diagnosis present

## 2021-07-05 DIAGNOSIS — O99210 Obesity complicating pregnancy, unspecified trimester: Secondary | ICD-10-CM | POA: Diagnosis present

## 2021-07-05 DIAGNOSIS — Z20822 Contact with and (suspected) exposure to covid-19: Secondary | ICD-10-CM | POA: Diagnosis present

## 2021-07-05 DIAGNOSIS — D696 Thrombocytopenia, unspecified: Secondary | ICD-10-CM | POA: Diagnosis present

## 2021-07-05 DIAGNOSIS — O3663X0 Maternal care for excessive fetal growth, third trimester, not applicable or unspecified: Secondary | ICD-10-CM | POA: Diagnosis present

## 2021-07-05 DIAGNOSIS — O24419 Gestational diabetes mellitus in pregnancy, unspecified control: Secondary | ICD-10-CM | POA: Diagnosis present

## 2021-07-05 DIAGNOSIS — O9912 Other diseases of the blood and blood-forming organs and certain disorders involving the immune mechanism complicating childbirth: Secondary | ICD-10-CM | POA: Diagnosis present

## 2021-07-05 DIAGNOSIS — O3660X0 Maternal care for excessive fetal growth, unspecified trimester, not applicable or unspecified: Secondary | ICD-10-CM | POA: Diagnosis present

## 2021-07-05 DIAGNOSIS — Z7982 Long term (current) use of aspirin: Secondary | ICD-10-CM

## 2021-07-05 DIAGNOSIS — O26893 Other specified pregnancy related conditions, third trimester: Secondary | ICD-10-CM | POA: Diagnosis present

## 2021-07-05 LAB — CBC
HCT: 44.7 % (ref 36.0–46.0)
Hemoglobin: 15 g/dL (ref 12.0–15.0)
MCH: 28.9 pg (ref 26.0–34.0)
MCHC: 33.6 g/dL (ref 30.0–36.0)
MCV: 86.1 fL (ref 80.0–100.0)
Platelets: 150 10*3/uL (ref 150–400)
RBC: 5.19 MIL/uL — ABNORMAL HIGH (ref 3.87–5.11)
RDW: 13.2 % (ref 11.5–15.5)
WBC: 9.1 10*3/uL (ref 4.0–10.5)
nRBC: 0 % (ref 0.0–0.2)

## 2021-07-05 LAB — TYPE AND SCREEN
ABO/RH(D): O POS
Antibody Screen: NEGATIVE

## 2021-07-05 LAB — RPR: RPR Ser Ql: NONREACTIVE

## 2021-07-05 LAB — RESP PANEL BY RT-PCR (FLU A&B, COVID) ARPGX2
Influenza A by PCR: NEGATIVE
Influenza B by PCR: NEGATIVE
SARS Coronavirus 2 by RT PCR: NEGATIVE

## 2021-07-05 MED ORDER — TERBUTALINE SULFATE 1 MG/ML IJ SOLN: 0.2500 mg | Freq: Once | INTRAMUSCULAR | Status: DC | PRN

## 2021-07-05 MED ORDER — LACTATED RINGERS IV SOLN: INTRAVENOUS | Status: DC

## 2021-07-05 MED ORDER — SOD CITRATE-CITRIC ACID 500-334 MG/5ML PO SOLN: 30.0000 mL | ORAL | Status: DC | PRN

## 2021-07-05 MED ORDER — OXYCODONE HCL 5 MG PO TABS: 5.0000 mg | ORAL_TABLET | ORAL | Status: DC | PRN

## 2021-07-05 MED ORDER — SIMETHICONE 80 MG PO CHEW: 80.0000 mg | CHEWABLE_TABLET | ORAL | Status: DC | PRN

## 2021-07-05 MED ORDER — FENTANYL CITRATE (PF) 100 MCG/2ML IJ SOLN: 50.0000 ug | INTRAMUSCULAR | Status: DC | PRN

## 2021-07-05 MED ORDER — PRENATAL MULTIVITAMIN CH
1.0000 | ORAL_TABLET | Freq: Every day | ORAL | Status: DC
Start: 2021-07-05 — End: 2021-07-06
  Administered 2021-07-05 – 2021-07-06 (×2): 1 via ORAL
  Filled 2021-07-05 (×2): qty 1

## 2021-07-05 MED ORDER — ONDANSETRON HCL 4 MG/2ML IJ SOLN: 4.0000 mg | Freq: Four times a day (QID) | INTRAMUSCULAR | Status: DC | PRN

## 2021-07-05 MED ORDER — LIDOCAINE HCL (PF) 1 % IJ SOLN: 30.0000 mL | INTRAMUSCULAR | Status: DC | PRN

## 2021-07-05 MED ORDER — SENNOSIDES-DOCUSATE SODIUM 8.6-50 MG PO TABS
2.0000 | ORAL_TABLET | Freq: Every day | ORAL | Status: DC
  Administered 2021-07-06: 2 via ORAL
  Filled 2021-07-05: qty 2

## 2021-07-05 MED ORDER — OXYCODONE HCL 5 MG PO TABS: 10.0000 mg | ORAL_TABLET | ORAL | Status: DC | PRN

## 2021-07-05 MED ORDER — ACETAMINOPHEN 325 MG PO TABS: 650.0000 mg | ORAL_TABLET | ORAL | Status: DC | PRN

## 2021-07-05 MED ORDER — ONDANSETRON HCL 4 MG PO TABS: 4.0000 mg | ORAL_TABLET | ORAL | Status: DC | PRN

## 2021-07-05 MED ORDER — DIPHENHYDRAMINE HCL 25 MG PO CAPS: 25.0000 mg | ORAL_CAPSULE | Freq: Four times a day (QID) | ORAL | Status: DC | PRN

## 2021-07-05 MED ORDER — ACETAMINOPHEN 325 MG PO TABS
650.0000 mg | ORAL_TABLET | ORAL | Status: DC | PRN
  Administered 2021-07-05 (×2): 650 mg via ORAL
  Filled 2021-07-05 (×2): qty 2

## 2021-07-05 MED ORDER — MISOPROSTOL 25 MCG QUARTER TABLET: 25.0000 ug | ORAL_TABLET | ORAL | Status: DC | PRN

## 2021-07-05 MED ORDER — OXYTOCIN-SODIUM CHLORIDE 30-0.9 UT/500ML-% IV SOLN: 2.5000 [IU]/h | INTRAVENOUS | Status: DC

## 2021-07-05 MED ORDER — COCONUT OIL OIL
1.0000 "application " | TOPICAL_OIL | Status: DC | PRN
  Administered 2021-07-06: 1 via TOPICAL

## 2021-07-05 MED ORDER — DIBUCAINE (PERIANAL) 1 % EX OINT: 1.0000 "application " | TOPICAL_OINTMENT | CUTANEOUS | Status: DC | PRN

## 2021-07-05 MED ORDER — OXYTOCIN 10 UNIT/ML IJ SOLN
INTRAMUSCULAR | Status: AC
  Administered 2021-07-05: 10 [IU]
  Filled 2021-07-05: qty 1

## 2021-07-05 MED ORDER — IBUPROFEN 600 MG PO TABS
600.0000 mg | ORAL_TABLET | Freq: Four times a day (QID) | ORAL | Status: DC
  Administered 2021-07-05 – 2021-07-06 (×5): 600 mg via ORAL
  Filled 2021-07-05 (×5): qty 1

## 2021-07-05 MED ORDER — OXYTOCIN-SODIUM CHLORIDE 30-0.9 UT/500ML-% IV SOLN
INTRAVENOUS | Status: AC
  Filled 2021-07-05: qty 500

## 2021-07-05 MED ORDER — BENZOCAINE-MENTHOL 20-0.5 % EX AERO
1.0000 "application " | INHALATION_SPRAY | CUTANEOUS | Status: DC | PRN
  Administered 2021-07-05: 1 via TOPICAL
  Filled 2021-07-05: qty 56

## 2021-07-05 MED ORDER — ONDANSETRON HCL 4 MG/2ML IJ SOLN: 4.0000 mg | INTRAMUSCULAR | Status: DC | PRN

## 2021-07-05 MED ORDER — LACTATED RINGERS IV SOLN
500.0000 mL | INTRAVENOUS | Status: DC | PRN
Start: 2021-07-05 — End: 2021-07-05

## 2021-07-05 MED ORDER — OXYTOCIN BOLUS FROM INFUSION
333.0000 mL | Freq: Once | INTRAVENOUS | Status: AC
  Administered 2021-07-05: 333 mL via INTRAVENOUS

## 2021-07-05 MED ORDER — WITCH HAZEL-GLYCERIN EX PADS: 1.0000 "application " | MEDICATED_PAD | CUTANEOUS | Status: DC | PRN

## 2021-07-05 NOTE — Discharge Summary (Addendum)
Postpartum Discharge Summary    Patient Name: Judy David DOB: [birth year] MRN: [id number]  Date of admission: 07/05/2021 Delivery date:07/05/2021  Delivering provider: Serita Grammes D  Date of discharge: 07/06/2021  Admitting diagnosis: GDM (gestational diabetes mellitus), class A1 [O24.410] Intrauterine pregnancy: [redacted]w[redacted]d     Secondary diagnosis:  Active Problems:   Language barrier   Obesity in pregnancy   Gestational diabetes   Gestational thrombocytopenia ([place])   LGA (large for gestational age) fetus affecting management of mother  Additional problems: None   Discharge diagnosis: Term Pregnancy Delivered and GDM A1                                              Post partum procedures: None Augmentation: N/A Complications: None  Hospital course: Onset of Labor With Vaginal Delivery      35 y.o. yo [id number] at [redacted]w[redacted]d was admitted in Active Labor on 07/05/2021. Patient had an uncomplicated labor course as follows:  Membrane Rupture Time/Date: 8:05 AM ,07/05/2021   Delivery Method:Vaginal, Spontaneous  Episiotomy: None  Lacerations:  1st degree;Perineal;Labial  Patient had an uncomplicated postpartum course.  She is ambulating, tolerating a regular diet, passing flatus, and urinating well. Patient is discharged home in stable condition on 07/06/21.  Newborn Data: Birth date:07/05/2021  Birth time:8:26 AM  Gender:Female  Living status:Living  Apgars:9 ,9  Weight:3700 g   Magnesium Sulfate received: No BMZ received: No Rhophylac:No MMR:No T-DaP:Given prenatally Flu: N/A Transfusion:No  Physical exam  Vitals:   07/05/21 1545 07/05/21 2030 07/06/21 0030 07/06/21 0545  BP: 107/81 136/84 (!) 136/58 130/78  Pulse: 83 66 73 62  Resp: $Remo'17 18 17 17  'lYJfd$ Temp: 99.2 F (37.3 C) 98.3 F (36.8 C) 98.2 F (36.8 C) 97.6 F (36.4 C)  TempSrc: Oral Oral Oral Oral  SpO2:  97% 96% 97%   General: alert, cooperative, and no distress Lochia: appropriate Uterine Fundus: firm DVT  Evaluation: No evidence of DVT seen on physical exam. Labs: Lab Results  Component Value Date   WBC 11.6 (H) 07/06/2021   HGB 12.1 07/06/2021   HCT 35.5 (L) 07/06/2021   MCV 87.0 07/06/2021   PLT 139 (L) 07/06/2021   No flowsheet data found. Edinburgh Score: Edinburgh Postnatal Depression Scale Screening Tool 07/06/2021  I have been able to laugh and see the funny side of things. 0  I have looked forward with enjoyment to things. 0  I have blamed myself unnecessarily when things went wrong. 3  I have been anxious or worried for no good reason. 0  I have felt scared or panicky for no good reason. 0  Things have been getting on top of me. 3  I have been so unhappy that I have had difficulty sleeping. 0  I have felt sad or miserable. 0  I have been so unhappy that I have been crying. 0  The thought of harming myself has occurred to me. 0  Edinburgh Postnatal Depression Scale Total 6     After visit meds:  Allergies as of 07/06/2021   No Known Allergies      Medication List     STOP taking these medications    aspirin EC 81 MG tablet       TAKE these medications    acetaminophen 325 MG tablet Commonly known as: Tylenol Take 2 tablets (650 mg total) by mouth  every 4 (four) hours as needed (for pain scale < 4).   ibuprofen 600 MG tablet Commonly known as: ADVIL Take 1 tablet (600 mg total) by mouth every 6 (six) hours.   prenatal vitamin w/FE, FA 27-1 MG Tabs tablet Take 1 tablet by mouth daily.         Discharge home in stable condition Infant Feeding: Breast Infant Disposition:home with mother Discharge instruction: per After Visit Summary and Postpartum booklet. Activity: Advance as tolerated. Pelvic rest for 6 weeks.  Diet: routine diet Future Appointments: No future appointments.  Follow up Visit:  Message sent by Dr. Higinio Plan to [facility] on 07/05/2021:   Please schedule this patient for a In person postpartum visit in 4 weeks with the following provider:  Any provider. Additional Postpartum F/U:2 hour GTT  High risk pregnancy complicated by: GDM Delivery mode:  Vaginal, Spontaneous  Anticipated Birth Control:   Patch after pp visit  vs nexplanon at HD     -patient would like to use condoms, does not now want to do patch -patient will have follow up visit at [facility]; message sent to the [facility] to make sure that patient is called for her visit; reinforced to the patient that she needs to be fasting when she has her visit so that we can do a GDM test   07/06/2021 [place]arr [place], CNM

## 2021-07-05 NOTE — MAU Note (Signed)
Patient arrived to MAU complaining of contractions that started at 4am that is every minute. Patient stated that she has bloody/clear discharge. +FM reported.

## 2021-07-05 NOTE — Lactation Note (Signed)
This note was copied from a baby's chart. Lactation Consultation Note  Patient Name: Judy David [id number] Date: 07/05/2021 Reason for consult: Initial assessment;Difficult latch Age:37 hours Consult was done in Spanish:  LC in to room for initial consult. Mother states infant is  latching well to right breast for a few minutes but unable to latch to the right. Demonstrated hand expression and used hand pump for nipple eversion  but unable to express colostrum. Several attempts to latch infant unsuccessful. Observed a few uncoordinated sucks, no swallows and lack of seal around breast. LC shared this information with RN.  Reviewed normal newborn behavior during first 24h, tummy size  expected output and feeding frequency.   Feeding plan:  1-Use manual pump for nipple eversion 2-Offer breast on demand or 8-12 times in 24h period. 3-Encouraged maternal rest, hydration and food intake.   All questions answered at this time. Provided Lactation services brochure and promoted INJoy booklet information.     Maternal Data Has patient been taught Hand Expression?: Yes Does the patient have breastfeeding experience prior to this delivery?: Yes How long did the patient breastfeed?: 8 months x2  Feeding Mother's Current Feeding Choice: Breast Milk  LATCH Score Latch: Too sleepy or reluctant, no latch achieved, no sucking elicited.  Audible Swallowing: None  Type of Nipple: Everted at rest and after stimulation (short nipples)  Comfort (Breast/Nipple): Soft / non-tender (dense tissue)  Hold (Positioning): Assistance needed to correctly position infant at breast and maintain latch.  LATCH Score: 5  Interventions Interventions: Breast feeding basics reviewed;Assisted with latch;Skin to skin;Breast massage;Hand express;Pre-pump if needed;Adjust position;Hand pump;Education  Discharge Pump: Manual WIC Program: Yes  Consult Status Consult Status: Follow-up Date:  07/06/21 Follow-up type: In-patient    Judy David 07/05/2021, 9:14 PM

## 2021-07-05 NOTE — Progress Notes (Signed)
   07/05/21 2030  Vital Signs  BP 136/84  BP Location Left Arm  Patient Position (if appropriate) Sitting  BP Method Automatic   During shift assessment, RN noticed that the pt's BP was slightly elevated. Pt has mild +1/non-pitting edema in lower extremities. However, the pt stated "that earlier in the day she experienced visual disturbances, that went away over time". Pt declined having any HA today. RN will continue to monitor and report off information.    Herbert Moors, RN

## 2021-07-05 NOTE — H&P (Signed)
OBSTETRIC ADMISSION HISTORY AND PHYSICAL  Judy David is a 35 y.o. female [phone] with IUP at [redacted]w[redacted]d by LMP presenting for SOL. Actually had induction scheduled tomorrow for A1GDM. She reports +FMs, No LOF, no VB, no blurry vision, headaches or peripheral edema, and RUQ pain.  She plans on breast feeding. She request the patch vs nexplanon for birth control. She received her prenatal care at [facility]   Dating: By LMP --->  Estimated Date of Delivery: 07/11/21  Sono:    @[redacted]w[redacted]d , CWD, normal anatomy, cephalic presentation,  3414g, 94% EFW   Prenatal History/Complications:  --A1GDM  --gestational thrombocytopenia, however plts normal on admit   Past Medical History: Past Medical History:  Diagnosis Date   Gestational diabetes    Preterm labor     Past Surgical History: Past Surgical History:  Procedure Laterality Date   EYE SURGERY      Obstetrical History: OB History     Gravida  4   Para  2   Term  2   Preterm  0   AB  1   Living  2      SAB  1   IAB      Ectopic      Multiple      Live Births  2           Social History Social History   Socioeconomic History   Marital status: Married    Spouse name: Not on file   Number of children: Not on file   Years of education: Not on file   Highest education level: Not on file  Occupational History   Not on file  Tobacco Use   Smoking status: Never   Smokeless tobacco: Never  Vaping Use   Vaping Use: Never used  Substance and Sexual Activity   Alcohol use: No   Drug use: No   Sexual activity: Yes    Birth control/protection: None  Other Topics Concern   Not on file  Social History Narrative   Not on file   Social Determinants of Health   Financial Resource Strain: Not on file  Food Insecurity: Food Insecurity Present   Worried About Running Out of Food in the Last Year: Never true   Ran Out of Food in the Last Year: Sometimes true  Transportation Needs: No Transportation  Needs   Lack of Transportation (Medical): No   Lack of Transportation (Non-Medical): No  Physical Activity: Not on file  Stress: Not on file  Social Connections: Not on file    Family History: Family History  Problem Relation Age of Onset   Depression Mother     Allergies: No Known Allergies  Medications Prior to Admission  Medication Sig Dispense Refill Last Dose   aspirin EC 81 MG tablet Take 1 tablet (81 mg total) by mouth daily. Swallow whole. 30 tablet 11 07/04/2021   prenatal vitamin w/FE, FA (PRENATAL 1 + 1) 27-1 MG TABS tablet Take 1 tablet by mouth daily. 30 tablet 11 07/04/2021     Review of Systems   All systems reviewed and negative except as stated in HPI  Blood pressure 128/73, pulse 70, temperature 98.2 F (36.8 C), temperature source Oral, resp. rate 20, last menstrual period 10/04/2020, unknown if currently breastfeeding. General appearance: alert, cooperative, and moderate distress Lungs: clear to auscultation bilaterally Heart: regular rate and rhythm Abdomen: soft, non-tender; bowel sounds normal Pelvic: NEFG Extremities: Homans sign is negative, no sign of DVT Presentation: cephalic Fetal  monitoringBaseline: 135 bpm, Variability: Good {> 6 bpm), Accelerations: Reactive, and Decelerations: Absent Uterine activityFrequency: Every 2 minutes Dilation: 10 Effacement (%): 100 Station: -1 Exam by:: H.Price, RN   Prenatal labs: ABO, Rh: --/--/O POS (09/06 6203) Antibody: NEG (09/06 5597) Rubella: 15.80 (02/24 1546) RPR: Non Reactive (06/20 0832)  HBsAg: Negative (02/24 1546)  HIV: Non Reactive (06/20 0832)  GBS: Negative/-- (08/19 0929)  1 hr Glucola abnormal  Genetic screening N/A Anatomy US normal   Prenatal Transfer Tool  Maternal Diabetes: Yes:  Diabetes Type:  Diet controlled Genetic Screening: Declined Maternal Ultrasounds/Referrals: Normal Fetal Ultrasounds or other Referrals:  None Maternal Substance Abuse:  No Significant Maternal  Medications:  None Significant Maternal Lab Results: Group B Strep negative  Results for orders placed or performed during the hospital encounter of 07/05/21 (from the past 24 hour(s))  CBC   Collection Time: 07/05/21  7:38 AM  Result Value Ref Range   WBC 9.1 4.0 - 10.5 K/uL   RBC 5.19 (H) 3.87 - 5.11 MIL/uL   Hemoglobin 15.0 12.0 - 15.0 g/dL   HCT 41.6 38.4 - 53.6 %   MCV 86.1 80.0 - 100.0 fL   MCH 28.9 26.0 - 34.0 pg   MCHC 33.6 30.0 - 36.0 g/dL   RDW 46.8 03.2 - 12.2 %   Platelets 150 150 - 400 K/uL   nRBC 0.0 0.0 - 0.2 %  Type and screen   Collection Time: 07/05/21  7:38 AM  Result Value Ref Range   ABO/RH(D) O POS    Antibody Screen NEG    Sample Expiration      07/08/2021,2359 Performed at [facility], [address] [place], [place] [address]     Patient Active Problem List   Diagnosis Date Noted   LGA (large for gestational age) fetus affecting management of mother 06/23/2021   Gestational thrombocytopenia (HCC) 05/06/2021   Gestational diabetes 04/20/2021   Advanced maternal age in multigravida 01/18/2021   Pre-diabetes 12/24/2020   Supervision of other normal pregnancy, antepartum 12/23/2020   Language barrier 12/23/2020   Obesity in pregnancy 12/23/2020    Assessment/Plan:  Judy David is a 35 y.o. [id number] at [redacted]w[redacted]d here for SOL.   #Labor: Expectant management, initally 7cm when arrived and has quickly progressed to complete. Anticipate vaginal delivery soon.  #Pain: PRN, no epidural  #FWB: Cat 1  #ID: GBS negative  #MOF: Breastfeeding #MOC: Considering nexplanon at HD or the patch  #Circ: N/A  #A1GDM: Will check fasting CBG postpartum. 2 hour gtt on follow up outpatient.   #LGA: EFW 94th percentile at 36 weeks. Will prepare for dystocia.   Allayne Stack, DO  07/05/2021, 9:13 AM

## 2021-07-06 ENCOUNTER — Inpatient Hospital Stay (HOSPITAL_COMMUNITY): Admission: AD | Admit: 2021-07-06 | Payer: Self-pay | Source: Home / Self Care | Admitting: Obstetrics & Gynecology

## 2021-07-06 ENCOUNTER — Inpatient Hospital Stay (HOSPITAL_COMMUNITY): Payer: Self-pay

## 2021-07-06 LAB — CBC
HCT: 35.5 % — ABNORMAL LOW (ref 36.0–46.0)
Hemoglobin: 12.1 g/dL (ref 12.0–15.0)
MCH: 29.7 pg (ref 26.0–34.0)
MCHC: 34.1 g/dL (ref 30.0–36.0)
MCV: 87 fL (ref 80.0–100.0)
Platelets: 139 10*3/uL — ABNORMAL LOW (ref 150–400)
RBC: 4.08 MIL/uL (ref 3.87–5.11)
RDW: 13.5 % (ref 11.5–15.5)
WBC: 11.6 10*3/uL — ABNORMAL HIGH (ref 4.0–10.5)
nRBC: 0 % (ref 0.0–0.2)

## 2021-07-06 LAB — GLUCOSE, CAPILLARY: Glucose-Capillary: 78 mg/dL (ref 70–99)

## 2021-07-06 LAB — BIRTH TISSUE RECOVERY COLLECTION (PLACENTA DONATION)

## 2021-07-06 MED ORDER — ACETAMINOPHEN 325 MG PO TABS: 650.0000 mg | ORAL_TABLET | ORAL | 0 refills | Status: AC | PRN

## 2021-07-06 MED ORDER — IBUPROFEN 200 MG PO TABS: 600.0000 mg | ORAL_TABLET | Freq: Four times a day (QID) | ORAL | Status: DC

## 2021-07-06 MED ORDER — ACETAMINOPHEN 325 MG PO TABS
650.0000 mg | ORAL_TABLET | ORAL | Status: DC | PRN
Start: 2021-07-06 — End: 2021-07-06

## 2021-07-06 MED ORDER — ACETAMINOPHEN 325 MG PO TABS: 650.0000 mg | ORAL_TABLET | ORAL | 0 refills | Status: DC | PRN

## 2021-07-06 MED ORDER — IBUPROFEN 600 MG PO TABS: 600.0000 mg | ORAL_TABLET | Freq: Four times a day (QID) | ORAL | 0 refills | Status: AC

## 2021-07-06 MED ORDER — IBUPROFEN 600 MG PO TABS: 600.0000 mg | ORAL_TABLET | Freq: Four times a day (QID) | ORAL | Status: DC

## 2021-07-06 NOTE — Lactation Note (Signed)
This note was copied from a baby's chart. Lactation Consultation Note  Patient Name: Judy David Date: 07/06/2021 Reason for consult: MD order;Difficult latch;Follow-up assessment;Term Age:37 hours  Consult was done in Spanish: LC in to room for follow up and potential discharge. "Judy David" is skin to skin after bath and actively cueing. Mother states infant's latch has improved, her colostrum is easily expressed now and "Judy David" is clusterfeeding.  LC offered assistance with latch. Infant gets fussy and it is difficult to latch to left breast, once offered right breast, latched with ease. Lower lip curls up, taught mother how to correct. Discussed normal behavior and patterns when clusterfeeding, voids and stools. Talked about milk coming into volume. Reviewed pump use and expressed milk storage.    Feeding plan: 1-Breastfeeding on demand or 8-12 times in 24h period, check for flanged lips and comfortable latch  2-Pump or hand-express and offer EBM after breastfeeding to "top infant up". 3-Encouraged maternal rest, hydration and food intake.   Contact LC as needed for feeds/support/concerns/questions. All questions answered at this time. Reviewed LC brochure and INJoy booklet.   Maternal Data Has patient been taught Hand Expression?: Yes Does the patient have breastfeeding experience prior to this delivery?: Yes  Feeding Mother's Current Feeding Choice: Breast Milk and Formula  LATCH Score Latch: Grasps breast easily, tongue down, lips flanged, rhythmical sucking. (Need to correct lower lip curling)  Audible Swallowing: Spontaneous and intermittent  Type of Nipple: Everted at rest and after stimulation (short shafted)  Comfort (Breast/Nipple): Soft / non-tender  Hold (Positioning): Assistance needed to correctly position infant at breast and maintain latch.  LATCH Score: 9   Lactation Tools Discussed/Used Tools: Pump Breast pump type: Manual Pump  Education: Milk Storage  Interventions Interventions: Assisted with latch;Skin to skin;Breast massage;Hand express;Pre-pump if needed;Adjust position;Education;Hand pump;Expressed milk;Position options;Breast feeding basics reviewed  Discharge Discharge Education: Engorgement and breast care;Warning signs for feeding baby Pump: Manual WIC Program: Yes  Consult Status Consult Status: Complete Date: 07/06/21 Follow-up type: Call as needed    Judy David 07/06/2021, 12:48 PM

## 2021-07-20 ENCOUNTER — Telehealth (HOSPITAL_COMMUNITY): Payer: Self-pay | Admitting: *Deleted

## 2021-07-20 NOTE — Telephone Encounter (Signed)
Interpreter reports mom is doing well. No concerns about herself. EPDS=2 (hospital score=6) Interpreter reports baby is fine. No concerns about baby.  Duffy Rhody, RN 07-20-2021 at 9:45am

## 2021-07-21 ENCOUNTER — Encounter: Payer: Self-pay | Admitting: General Practice

## 2021-08-03 ENCOUNTER — Other Ambulatory Visit: Payer: Self-pay

## 2021-08-04 ENCOUNTER — Ambulatory Visit: Payer: Medicaid Other | Admitting: Obstetrics & Gynecology

## 2021-08-04 ENCOUNTER — Other Ambulatory Visit: Payer: Self-pay

## 2021-08-04 ENCOUNTER — Ambulatory Visit (INDEPENDENT_AMBULATORY_CARE_PROVIDER_SITE_OTHER): Payer: Medicaid Other | Admitting: Obstetrics & Gynecology

## 2021-08-04 ENCOUNTER — Encounter: Payer: Self-pay | Admitting: Obstetrics & Gynecology

## 2021-08-04 VITALS — BP 118/68 | HR 79 | Wt 163.8 lb

## 2021-08-04 DIAGNOSIS — Z23 Encounter for immunization: Secondary | ICD-10-CM

## 2021-08-04 NOTE — Progress Notes (Signed)
    Post Partum Visit Note  Judy David is a 35 y.o. [phone] female who presents for a postpartum visit. She is 4 weeks postpartum following a normal spontaneous vaginal delivery.  I have fully reviewed the prenatal and intrapartum course. The delivery was at [redacted]w[redacted]d gestational weeks.  Anesthesia: none. Postpartum course has been good. Baby is doing well. Baby is feeding by breast. Bleeding no bleeding. Bowel function is abnormal. Bladder function is normal. Patient is not sexually active. Contraception method is none. Postpartum depression screening: negative.   The pregnancy intention screening data noted above was reviewed. Potential methods of contraception were discussed. The patient elected to proceed with No data recorded.    Health Maintenance Due  Topic Date Due   COVID-19 Vaccine (3 - Booster for Pfizer series) 01/28/2021   INFLUENZA VACCINE  05/30/2021    The following portions of the patient's history were reviewed and updated as appropriate: allergies, current medications, past family history, past medical history, past social history, past surgical history, and problem list.  Review of Systems Pertinent items are noted in HPI.  Objective:  There were no vitals taken for this visit.   General:  alert, cooperative, and no distress   Breasts:  normal        Abdomen: soft, non-tender; bowel sounds normal; no masses,  no organomegaly      GU exam:  not indicated       Assessment:    There are no diagnoses linked to this encounter.  normal postpartum exam.   Plan:   Essential components of care per ACOG recommendations:  1.  Mood and well being: Patient with negative depression screening today. Reviewed local resources for support.  - Patient tobacco use? No.   - hx of drug use? No.    2. Infant care and feeding:  -Patient currently breastmilk feeding? Yes. Discussed returning to work and pumping.  -Social determinants of health (SDOH) reviewed  in EPIC. No concerns 3. Sexuality, contraception and birth spacing - Patient does not want a pregnancy in the next year.  Desired family size is 3 children.  - Reviewed forms of contraception in tiered fashion. Patient desired  Nexplanon  today.   - Discussed birth spacing of 18 months  4. Sleep and fatigue -Encouraged family/partner/community support of 4 hrs of uninterrupted sleep to help with mood and fatigue  5. Physical Recovery  - Discussed patients delivery and complications. She describes her labor as good. - Patient had a Vaginal, no problems at delivery. Patient had a 1st degree laceration. Perineal healing reviewed. Patient expressed understanding - Patient has urinary incontinence? No. - Patient is safe to resume physical and sexual activity  6.  Health Maintenance - HM due items addressed Yes - Last pap smear  Diagnosis  Date Value Ref Range Status  12/23/2020   Final   - Negative for intraepithelial lesion or malignancy (NILM)   Pap smear not done at today's visit.  -Breast Cancer screening indicated? no  7. Chronic Disease/Pregnancy Condition follow up: Gestational Diabetes Adam Phenix, MD  Center for [facility]

## 2021-08-05 LAB — GLUCOSE TOLERANCE, 2 HOURS
Glucose, 2 hour: 115 mg/dL (ref 65–139)
Glucose, GTT - Fasting: 82 mg/dL (ref 65–99)

## 2021-08-08 ENCOUNTER — Telehealth: Payer: Self-pay

## 2021-08-08 NOTE — Telephone Encounter (Signed)
-----   Message from Adam Phenix, MD sent at 08/08/2021 11:43 AM EDT ----- Negative test for diabetes after delivery

## 2021-08-08 NOTE — Telephone Encounter (Signed)
Call placed to pt with interpreter Raquel. Pt given results and recommendations per Dr Debroah Loop . Pt verbalized understanding and agreeable to plan of care.  Judeth Cornfield, RN

## 2022-07-16 IMAGING — US US MFM OB DETAIL+14 WK
1 series · 13 of 28 positions shown · non-contrast
Comparison: none

[Series 1: us mfm ob detail+14 wk · 112 acquisitions, 13 frames shown]
[im 5/112]
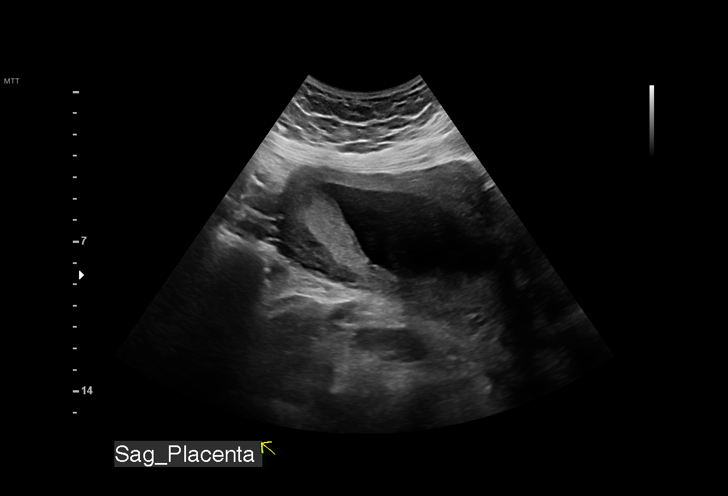
[im 13/112]
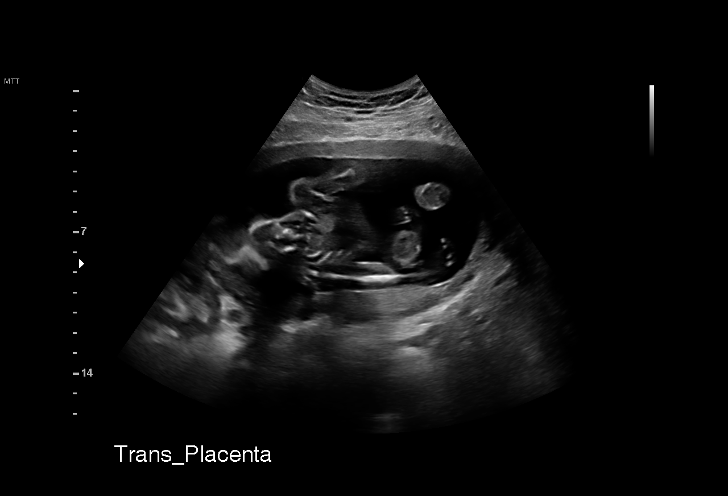
[im 21/112]
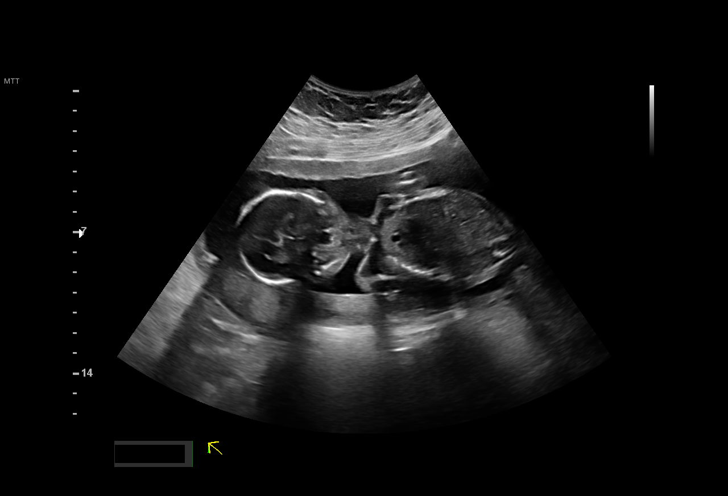
[im 29/112]
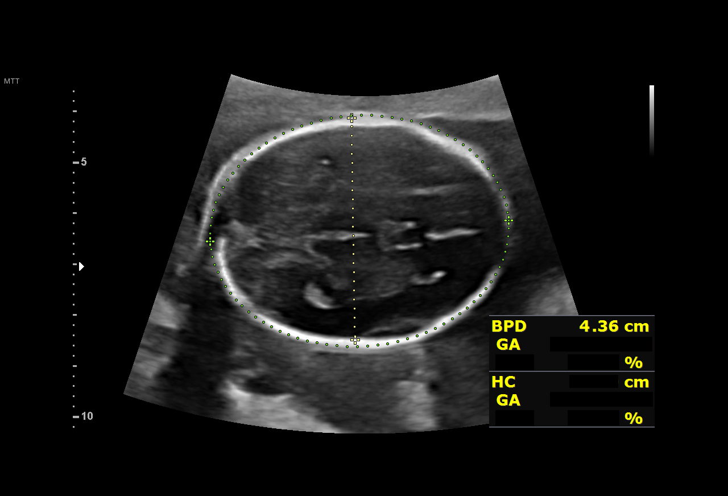
[im 38/112]
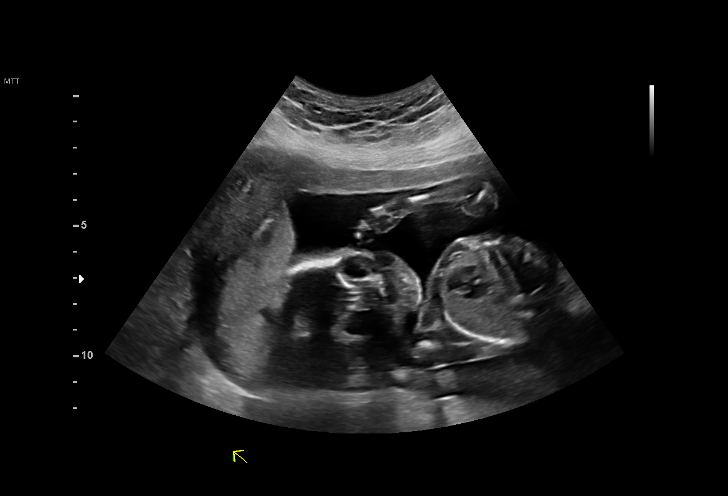
[im 46/112]
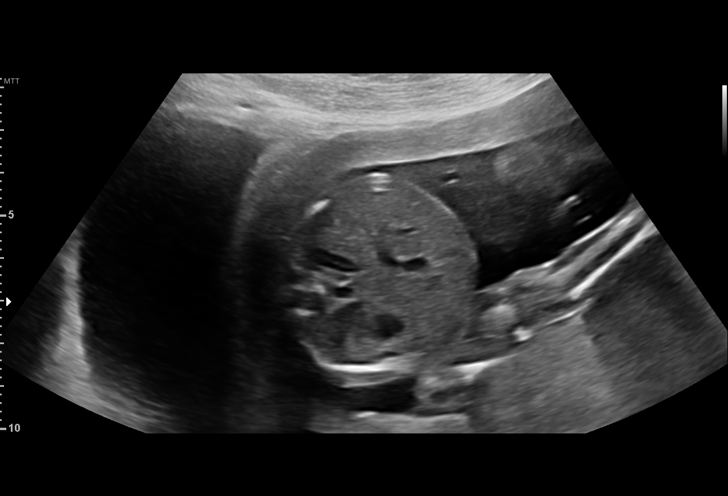
[im 58/112]
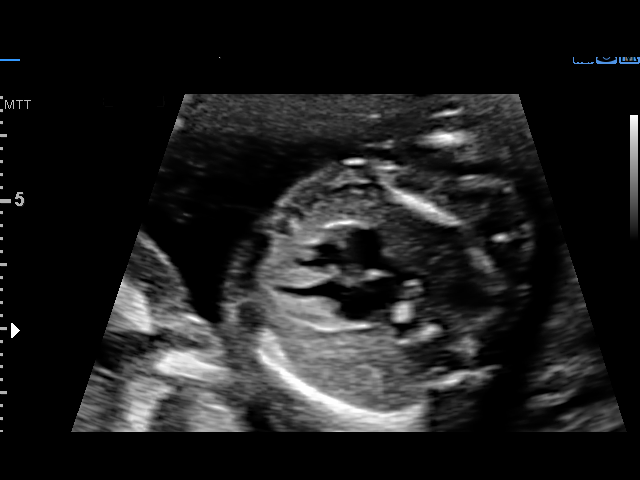
[im 66/112]
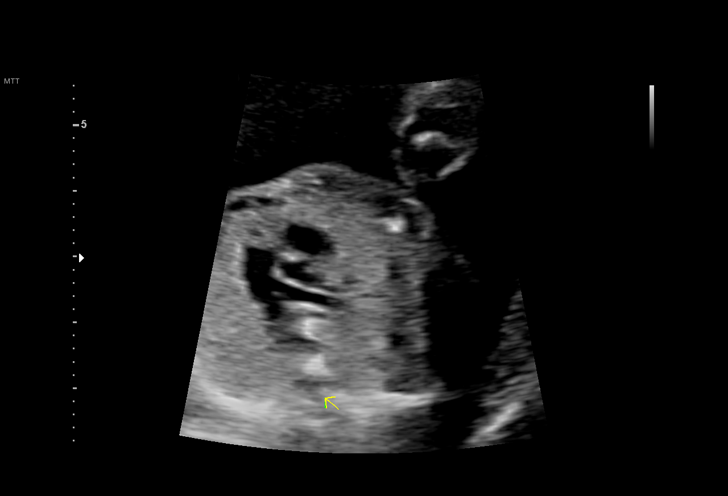
[im 75/112]
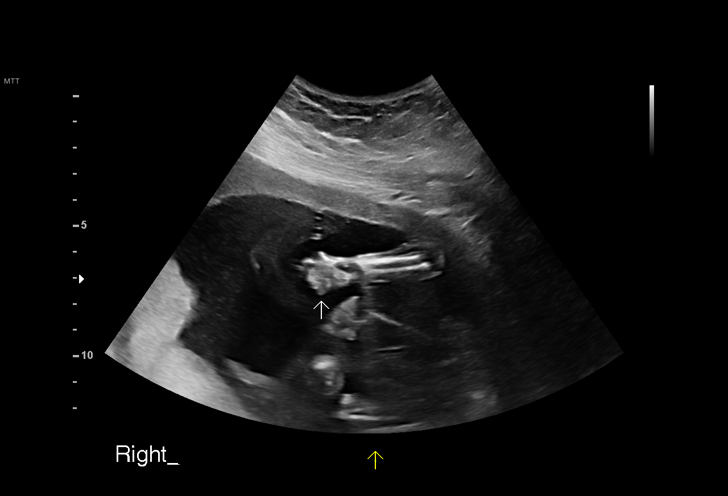
[im 83/112]
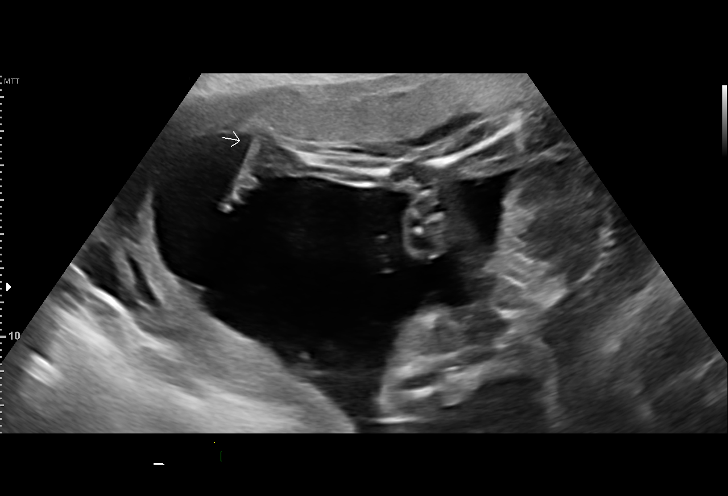
[im 91/112]
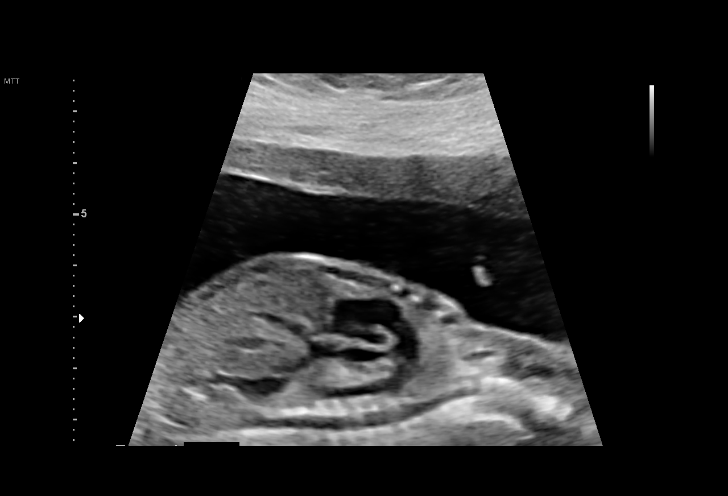
[im 99/112]
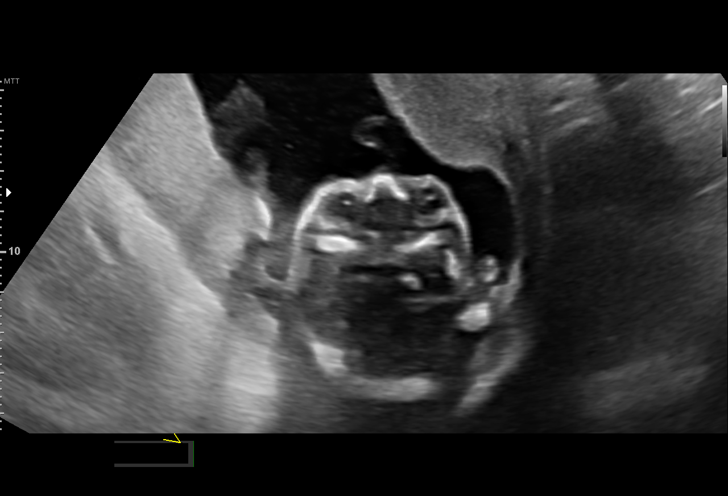
[im 107/112]
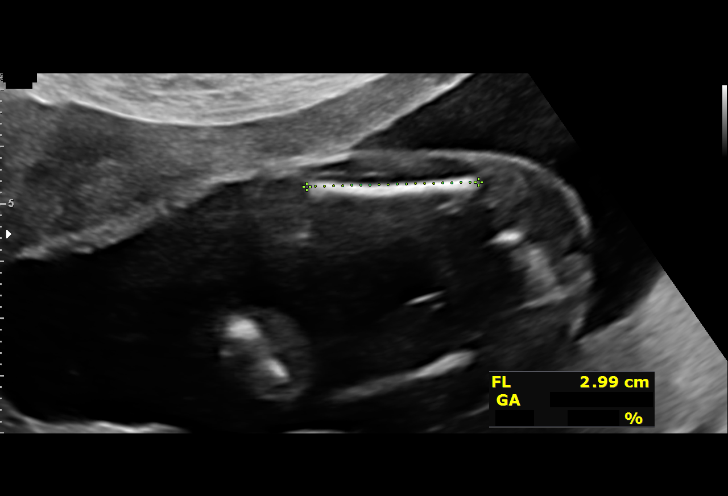

[13 of 28 positions shown; findings below may reference images not displayed]

KACANOVSKIJ

                                                            BONDOC

Indications

 19 weeks gestation of pregnancy
 Advanced maternal age multigravida 33+,
 second trimester
 Encounter for antenatal screening for
 malformations
 LR NIPS/ Negative Horizon
 Obesity complicating pregnancy, second
 trimester
Fetal Evaluation

 Num Of Fetuses:         1
 Fetal Heart Rate(bpm):  155
 Cardiac Activity:       Observed
 Presentation:           Variable
 Placenta:               Posterior
 P. Cord Insertion:      Visualized, central

 Amniotic Fluid
 AFI FV:      Within normal limits

                             Largest Pocket(cm)

Biometry
 BPD:      43.7  mm     G. Age:  19w 2d         60  %    CI:        70.81   %    70 - 86
                                                         FL/HC:      17.8   %    16.1 -
 HC:      165.5  mm     G. Age:  19w 2d         55  %    HC/AC:      1.08        1.09 -
 AC:      153.3  mm     G. Age:  20w 4d         89  %    FL/BPD:     67.5   %
 FL:       29.5  mm     G. Age:  19w 1d         47  %    FL/AC:      19.2   %    20 - 24
 HUM:      27.1  mm     G. Age:  18w 4d         40  %
 CER:      20.4  mm     G. Age:  19w 4d         64  %
 NFT:       3.9  mm
 LV:        6.6  mm
 CM:        5.7  mm

 Est. FW:     313  gm    0 lb 11 oz      88  %
OB History

 Blood Type:   O+
 Gravidity:    1         Term:   0        Prem:   0        SAB:   0
 TOP:          0       Ectopic:  0        Living: 0
Gestational Age

 LMP:           19w 0d        Date:  10/04/20                 EDD:   07/11/21
 U/S Today:     19w 4d                                        EDD:   07/07/21
 Best:          19w 0d     Det. By:  LMP  (10/04/20)          EDD:   07/11/21
Anatomy

 Cranium:               Appears normal         LVOT:                   Appears normal
 Cavum:                 Appears normal         Aortic Arch:            Appears normal
 Ventricles:            Appears normal         Ductal Arch:            Appears normal
 Choroid Plexus:        Appears normal         Diaphragm:              Appears normal
 Cerebellum:            Appears normal         Stomach:                Appears normal, left
                                                                       sided
 Posterior Fossa:       Appears normal         Abdomen:                Appears normal
 Nuchal Fold:           Appears normal         Abdominal Wall:         Appears nml (cord
                                                                       insert, abd wall)
 Face:                  Appears normal         Cord Vessels:           Appears normal (3
                        (orbits and profile)                           vessel cord)
 Lips:                  Appears normal         Kidneys:                Appear normal
 Palate:                Appears normal         Bladder:                Appears normal
 Thoracic:              Appears normal         Spine:                  Appears normal
 Heart:                 Appears normal         Upper Extremities:      Appears normal
                        (4CH, axis, and
                        situs)
 RVOT:                  Appears normal         Lower Extremities:      Appears normal

 Other:  Fetus appears to be female. Heels/feet and open hands/5th digits
         visualized. Technically difficult due to maternal habitus and fetal
         position.
Cervix Uterus Adnexa

 Cervix
 Length:           4.55  cm.
 Normal appearance by transabdominal scan.
 Uterus
 No abnormality visualized.

 Right Ovary
 Within normal limits.

 Left Ovary
 Within normal limits.

 Cul De Sac
 No free fluid seen.

 Adnexa
 No abnormality visualized.
Impression

 Single intrauterine pregnancy here for a detailed anatomy
 due to elevated BMI and AMA
 Normal anatomy with measurements consistent with dates
 There is good fetal movement and amniotic fluid volume
Recommendations

 Follow up growth scheduled in 6-8 weeks.
